# Patient Record
Sex: Male | Born: 1980 | Race: Black or African American | Hispanic: No | Marital: Married | Smoking: Never smoker
Health system: Southern US, Community
[De-identification: ages and names within clinical notes are randomized; demographics above are authoritative.]

## PROBLEM LIST (undated history)

## (undated) DIAGNOSIS — U071 COVID-19: Secondary | ICD-10-CM

## (undated) DIAGNOSIS — E119 Type 2 diabetes mellitus without complications: Secondary | ICD-10-CM

## (undated) DIAGNOSIS — I10 Essential (primary) hypertension: Secondary | ICD-10-CM

## (undated) HISTORY — DX: Essential (primary) hypertension: I10

## (undated) HISTORY — PX: KNEE ARTHROSCOPY WITH ANTERIOR CRUCIATE LIGAMENT (ACL) REPAIR: SHX5644

## (undated) HISTORY — DX: Type 2 diabetes mellitus without complications: E11.9

---

## 2005-05-20 ENCOUNTER — Emergency Department (HOSPITAL_COMMUNITY): Admission: EM | Admit: 2005-05-20 | Discharge: 2005-05-20 | Payer: Self-pay | Admitting: Emergency Medicine

## 2010-10-09 ENCOUNTER — Emergency Department (HOSPITAL_COMMUNITY): Admission: EM | Admit: 2010-10-09 | Discharge: 2010-10-09 | Payer: Self-pay | Admitting: Family Medicine

## 2011-10-29 ENCOUNTER — Inpatient Hospital Stay (INDEPENDENT_AMBULATORY_CARE_PROVIDER_SITE_OTHER)
Admission: RE | Admit: 2011-10-29 | Discharge: 2011-10-29 | Disposition: A | Discharge status: Home / Self Care | Payer: BC Managed Care – PPO | Source: Ambulatory Visit | Attending: Family Medicine | Admitting: Family Medicine

## 2011-10-29 DIAGNOSIS — S335XXA Sprain of ligaments of lumbar spine, initial encounter: Secondary | ICD-10-CM

## 2012-03-06 ENCOUNTER — Ambulatory Visit: Payer: BC Managed Care – PPO | Admitting: Internal Medicine

## 2013-03-31 ENCOUNTER — Ambulatory Visit
Admission: RE | Admit: 2013-03-31 | Discharge: 2013-03-31 | Disposition: A | Discharge status: Home / Self Care | Payer: BC Managed Care – PPO | Source: Ambulatory Visit | Attending: Internal Medicine | Admitting: Internal Medicine

## 2013-03-31 ENCOUNTER — Other Ambulatory Visit: Payer: Self-pay | Admitting: Family

## 2013-03-31 DIAGNOSIS — Z139 Encounter for screening, unspecified: Secondary | ICD-10-CM

## 2013-07-10 ENCOUNTER — Encounter (HOSPITAL_COMMUNITY): Payer: Self-pay | Admitting: Emergency Medicine

## 2013-07-10 ENCOUNTER — Emergency Department (HOSPITAL_COMMUNITY)
Admission: EM | Admit: 2013-07-10 | Discharge: 2013-07-11 | Disposition: A | Discharge status: Home / Self Care | Payer: BC Managed Care – PPO | Attending: Emergency Medicine | Admitting: Emergency Medicine

## 2013-07-10 DIAGNOSIS — I10 Essential (primary) hypertension: Secondary | ICD-10-CM | POA: Insufficient documentation

## 2013-07-10 DIAGNOSIS — F10920 Alcohol use, unspecified with intoxication, uncomplicated: Secondary | ICD-10-CM

## 2013-07-10 DIAGNOSIS — R112 Nausea with vomiting, unspecified: Secondary | ICD-10-CM | POA: Insufficient documentation

## 2013-07-10 DIAGNOSIS — D696 Thrombocytopenia, unspecified: Secondary | ICD-10-CM

## 2013-07-10 DIAGNOSIS — F1010 Alcohol abuse, uncomplicated: Secondary | ICD-10-CM | POA: Insufficient documentation

## 2013-07-10 HISTORY — DX: Essential (primary) hypertension: I10

## 2013-07-10 LAB — URINALYSIS, ROUTINE W REFLEX MICROSCOPIC
Glucose, UA: NEGATIVE mg/dL
Ketones, ur: NEGATIVE mg/dL
Leukocytes, UA: NEGATIVE
Nitrite: NEGATIVE
Protein, ur: NEGATIVE mg/dL
Urobilinogen, UA: 0.2 mg/dL (ref 0.0–1.0)

## 2013-07-10 LAB — RAPID URINE DRUG SCREEN, HOSP PERFORMED
Amphetamines: NOT DETECTED
Opiates: NOT DETECTED

## 2013-07-10 LAB — COMPREHENSIVE METABOLIC PANEL
ALT: 29 U/L (ref 0–53)
Alkaline Phosphatase: 76 U/L (ref 39–117)
BUN: 12 mg/dL (ref 6–23)
CO2: 25 mEq/L (ref 19–32)
Chloride: 104 mEq/L (ref 96–112)
GFR calc Af Amer: >90 mL/min (ref >90)
GFR calc non Af Amer: >90 mL/min (ref >90)
Glucose, Bld: 110 mg/dL — ABNORMAL HIGH (ref 70–99)
Potassium: 4 mEq/L (ref 3.5–5.1)
Sodium: 141 mEq/L (ref 135–145)
Total Bilirubin: 0.2 mg/dL — ABNORMAL LOW (ref 0.3–1.2)
Total Protein: 7.5 g/dL (ref 6.0–8.3)

## 2013-07-10 LAB — CBC
Hemoglobin: 13.3 g/dL (ref 13.0–17.0)
MCH: 25.2 pg — ABNORMAL LOW (ref 26.0–34.0)
MCHC: 31.6 g/dL (ref 30.0–36.0)
Platelets: 135 10*3/uL — ABNORMAL LOW (ref 150–400)

## 2013-07-10 LAB — GLUCOSE, CAPILLARY: Glucose-Capillary: 99 mg/dL (ref 70–99)

## 2013-07-10 LAB — ETHANOL: Alcohol, Ethyl (B): 159 mg/dL — ABNORMAL HIGH (ref 0–11)

## 2013-07-10 MED ORDER — SODIUM CHLORIDE 0.9 % IV SOLN
1000.0000 mL | Freq: Once | INTRAVENOUS | Status: AC
  Administered 2013-07-10: 1000 mL via INTRAVENOUS

## 2013-07-10 MED ORDER — SODIUM CHLORIDE 0.9 % IV SOLN: 1000.0000 mL | INTRAVENOUS | Status: DC

## 2013-07-10 NOTE — ED Notes (Signed)
Per EMS, [place] [employer] [place]. Had a syncopal episode with vomiting. 12 lead WNL, No pain. 18g RAC with 250 cc bolus enroute.

## 2013-07-10 NOTE — ED Notes (Signed)
Pt is unable to urinate at this time. 

## 2013-07-10 NOTE — ED Provider Notes (Signed)
History    CSN: [id number] Arrival date & time 07/10/13  2008  First MD Initiated Contact with Patient 07/10/13 2016     Chief Complaint  Patient presents with  . Alcohol Intoxication   (Consider location/radiation/quality/duration/timing/severity/associated sxs/prior Treatment) Patient is a 32 y.o. male presenting with intoxication. The history is provided by the patient and medical records. No language interpreter was used.  Alcohol Intoxication Associated symptoms include nausea and vomiting. Pertinent negatives include no abdominal pain, chest pain, coughing, diaphoresis, fatigue, fever, headaches or rash.    Jia D Rafalski is a 32 y.o. male  with a hx of HTN presents to the Emergency Department complaining of acute, resolved syncopal episode with associated nausea and vomiting occuring approx prior to arrival.  Pt was with friends at the beer fest drinking an unknown quantity of alcohol.  Pt's friend went to the bathroom and found the pt lying on the ground vomiting.  His friend reported that there was a syncopal episode. Pt states that he sat down and then layed down on the ground before vomiting and passing out.  Pt denies fall, headache, neck pain  EMS reports normal ECG and no injuries found on evaluation. Pt denies fever, chills, headache, neck pain, chest pain, SOB, abd pain, diarrhea, weakness, dizziness, syncope, dysuria, hematuria.    Past Medical History  Diagnosis Date  . Hypertension    History reviewed. No pertinent past surgical history. History reviewed. No pertinent family history. History  Substance Use Topics  . Smoking status: Never Smoker   . Smokeless tobacco: Not on file  . Alcohol Use: Yes    Review of Systems  Constitutional: Negative for fever, diaphoresis, appetite change, fatigue and unexpected weight change.  HENT: Negative for mouth sores and neck stiffness.   Eyes: Negative for visual disturbance.  Respiratory: Negative for cough, chest  tightness, shortness of breath and wheezing.   Cardiovascular: Negative for chest pain.  Gastrointestinal: Positive for nausea and vomiting. Negative for abdominal pain, diarrhea and constipation.  Endocrine: Negative for polydipsia, polyphagia and polyuria.  Genitourinary: Negative for dysuria, urgency, frequency and hematuria.  Musculoskeletal: Negative for back pain.  Skin: Negative for rash.  Allergic/Immunologic: Negative for immunocompromised state.  Neurological: Positive for syncope (questionable). Negative for light-headedness and headaches.  Hematological: Does not bruise/bleed easily.  Psychiatric/Behavioral: Negative for sleep disturbance. The patient is not nervous/anxious.     Allergies  Review of patient's allergies indicates no known allergies.  Home Medications  No current outpatient prescriptions on file. BP 118/70  Pulse 78  Temp(Src) 97.7 F (36.5 C) (Oral)  Resp 18  SpO2 94% Physical Exam  Nursing note and vitals reviewed. Constitutional: He is oriented to person, place, and time. He appears well-developed and well-nourished. No distress.  Awake, alert, nontoxic appearance  HENT:  Head: Normocephalic and atraumatic.  Right Ear: Tympanic membrane, external ear and ear canal normal.  Left Ear: Tympanic membrane, external ear and ear canal normal.  Nose: Nose normal. No mucosal edema or rhinorrhea.  Mouth/Throat: Uvula is midline, oropharynx is clear and moist and mucous membranes are normal. No edematous. No oropharyngeal exudate, posterior oropharyngeal edema, posterior oropharyngeal erythema or tonsillar abscesses.  No contusion or abrasion to the head noted  Eyes: Conjunctivae are normal. No scleral icterus.  Neck: Normal range of motion. Neck supple.  Cardiovascular: Normal rate, regular rhythm, normal heart sounds and intact distal pulses.   No murmur heard. Pulmonary/Chest: Effort normal and breath sounds normal. No respiratory distress. He  has no  wheezes.  Abdominal: Soft. Bowel sounds are normal. He exhibits no mass. There is no tenderness. There is no rebound and no guarding.  Musculoskeletal: Normal range of motion. He exhibits no edema.  Full ROM of all major joints  Neurological: He is alert and oriented to person, place, and time. No cranial nerve deficit. He exhibits normal muscle tone. Coordination normal.  Pt significantly intoxicated, but with prompting speech is clear and he answers questions appropriately. Moves extremities without ataxia  Skin: Skin is warm and dry. No rash noted. He is not diaphoretic. No erythema.  Psychiatric: He has a normal mood and affect. His behavior is normal.    ED Course  Procedures (including critical care time) Labs Reviewed  ETHANOL - Abnormal; Notable for the following:    Alcohol, Ethyl (B) 159 (*)    All other components within normal limits  CBC - Abnormal; Notable for the following:    MCH 25.2 (*)    Platelets 135 (*)    All other components within normal limits  COMPREHENSIVE METABOLIC PANEL - Abnormal; Notable for the following:    Glucose, Bld 110 (*)    Total Bilirubin 0.2 (*)    All other components within normal limits  URINALYSIS, ROUTINE W REFLEX MICROSCOPIC  URINE RAPID DRUG SCREEN (HOSP PERFORMED)  GLUCOSE, CAPILLARY  TROPONIN I  POCT CBG (FASTING - GLUCOSE)-MANUAL ENTRY   ECG:  Date: 07/11/2013  Rate: 79  Rhythm: normal sinus rhythm  QRS Axis: normal  Intervals: normal  ST/T Wave abnormalities: normal  Conduction Disutrbances:none  Narrative Interpretation: Nonischemic ECG, normal sinus rhythm, no old for comparison  Old EKG Reviewed: none available    No results found. 1. Alcohol intoxication, episodic, uncomplicated   2. Thrombocytopenia     MDM  Koi D Brumett presents with questionable syncope and intoxication.  No injury found on exam.  Pt to be given fluids and will check labs.  CBG WNL.    12:15 AM  Pt neurologically intact.  Normal  neuro exam: Speech is clear and goal oriented, follows commands Major Cranial nerves without deficit, no facial droop Normal strength in upper and lower extremities bilaterally including dorsiflexion and plantar flexion, strong and equal grip strength Sensation normal to light and sharp touch Moves extremities without ataxia, coordination intact Normal finger to nose and rapid alternating movements Neg romberg, no pronator drift Normal gait and balance  UDS negative, UA without evidence of UTI, CBC and CMP largely unremarkable.  Pt with low platelets found on CBC; recommend f/u with PCP.  EtOH 159.  Pt ambulates without difficulty, is alert and oriented, nontoxic, nonseptic appearing.  Pt requests to be d/c home. NO CT scan of the head warranted as there is no evidence that he hit his head and his mentation is adequate and he is without neuro deficit.  Will d/c home with strict return precautions.  I have also discussed reasons to return immediately to the ER with pts wife as well.  Patient expresses understanding and agrees with plan.    Dahlia Client Kyasia Steuck, PA-C 07/11/13 0153

## 2013-07-10 NOTE — ED Notes (Signed)
Bed:WA15<BR> Expected date:<BR> Expected time:<BR> Means of arrival:<BR> Comments:<BR> EMS

## 2013-07-10 NOTE — ED Notes (Signed)
Pt attempted to urinate and was unsuccessful 

## 2013-07-12 NOTE — ED Provider Notes (Signed)
Medical screening examination/treatment/procedure(s) were performed by non-physician practitioner and as supervising physician I was immediately available for consultation/collaboration.   Ronny Korff L Younes Degeorge, MD 07/12/13 1539 

## 2016-04-23 ENCOUNTER — Other Ambulatory Visit: Payer: Self-pay | Admitting: Nurse Practitioner

## 2016-04-23 ENCOUNTER — Ambulatory Visit
Admission: RE | Admit: 2016-04-23 | Discharge: 2016-04-23 | Disposition: A | Discharge status: Home / Self Care | Payer: BLUE CROSS/BLUE SHIELD | Source: Ambulatory Visit | Attending: Internal Medicine | Admitting: Internal Medicine

## 2016-04-23 DIAGNOSIS — R7611 Nonspecific reaction to tuberculin skin test without active tuberculosis: Secondary | ICD-10-CM | POA: Diagnosis not present

## 2016-04-30 DIAGNOSIS — Z23 Encounter for immunization: Secondary | ICD-10-CM | POA: Diagnosis not present

## 2016-05-02 DIAGNOSIS — R7309 Other abnormal glucose: Secondary | ICD-10-CM | POA: Diagnosis not present

## 2016-05-02 DIAGNOSIS — I10 Essential (primary) hypertension: Secondary | ICD-10-CM | POA: Diagnosis not present

## 2016-05-02 DIAGNOSIS — Z79899 Other long term (current) drug therapy: Secondary | ICD-10-CM | POA: Diagnosis not present

## 2016-05-02 DIAGNOSIS — E669 Obesity, unspecified: Secondary | ICD-10-CM | POA: Diagnosis not present

## 2016-05-02 DIAGNOSIS — Z0000 Encounter for general adult medical examination without abnormal findings: Secondary | ICD-10-CM | POA: Diagnosis not present

## 2016-08-01 DIAGNOSIS — R7309 Other abnormal glucose: Secondary | ICD-10-CM | POA: Diagnosis not present

## 2016-08-01 DIAGNOSIS — I10 Essential (primary) hypertension: Secondary | ICD-10-CM | POA: Diagnosis not present

## 2016-10-31 DIAGNOSIS — I10 Essential (primary) hypertension: Secondary | ICD-10-CM | POA: Diagnosis not present

## 2016-10-31 DIAGNOSIS — R0683 Snoring: Secondary | ICD-10-CM | POA: Diagnosis not present

## 2016-11-27 ENCOUNTER — Encounter: Payer: Self-pay | Admitting: Neurology

## 2016-11-27 ENCOUNTER — Ambulatory Visit (INDEPENDENT_AMBULATORY_CARE_PROVIDER_SITE_OTHER): Payer: BLUE CROSS/BLUE SHIELD | Admitting: Neurology

## 2016-11-27 VITALS — BP 171/107 | HR 80 | Resp 20 | Ht 68.0 in | Wt 236.0 lb

## 2016-11-27 DIAGNOSIS — R0683 Snoring: Secondary | ICD-10-CM | POA: Diagnosis not present

## 2016-11-27 DIAGNOSIS — G4719 Other hypersomnia: Secondary | ICD-10-CM | POA: Diagnosis not present

## 2016-11-27 DIAGNOSIS — G4726 Circadian rhythm sleep disorder, shift work type: Secondary | ICD-10-CM | POA: Diagnosis not present

## 2016-11-27 DIAGNOSIS — G4733 Obstructive sleep apnea (adult) (pediatric): Secondary | ICD-10-CM

## 2016-11-27 DIAGNOSIS — E66812 Obesity, class 2: Secondary | ICD-10-CM | POA: Insufficient documentation

## 2016-11-27 DIAGNOSIS — Z6841 Body Mass Index (BMI) 40.0 and over, adult: Secondary | ICD-10-CM | POA: Insufficient documentation

## 2016-11-27 MED ORDER — MODAFINIL 200 MG PO TABS: 200.0000 mg | ORAL_TABLET | Freq: Every day | ORAL | 5 refills | Status: DC

## 2016-11-27 NOTE — Progress Notes (Signed)
SLEEP MEDICINE CLINIC   Provider:  Melvyn Novas, M D  Referring Provider: Arnette Felts, FNP Primary Care Physician:  No primary care provider on file.  Chief Complaint  Patient presents with  . New Patient (Initial Visit)    snores at night, never had sleep study    HPI:  Austin [name] is a 35 y.o. male , seen here as a referral from NP J.  Moore for a sleep consultation,  Mr. kon is a 35 year old married right-handed African-American gentleman who began developing hypertension in the year 2004 at age 50. He has a family history positive for hypertension, gout, but no family history of coronary artery disease or myocardial infarction. He does have an active lifestyle, he does not have distinct exercise routine. He works night shifts. His wife reports him to snore loudly and to have witnessed apneas.  Chief complaint according to patient : He works night shifts. His wife reports him to snore loudly and to have witnessed apneas. Overall he feels that his sleep quality has deteriorated. His sleep is not as refreshing as restorative as it used to be.  Sleep habits are as follows: His work routine is 2 days on night shift followed by 2 days off, then followed by 3 days on shift and 3 days off. He wakes up usually around 6 AM when he could sleep overnight. His bedtime is around 10 PM. The bedroom is described as core, quiet and dark. He prefers to sleep on his stomach and uses one pillow. He usually sleeps through the night between 10 and 6 getting 7-1/2 hours of sleep. He does sometimes remember his dreams, he has no nocturia.  Night shift last from 7 PM to 7 AM. 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 This can be one or 2 hours. Again it is not sufficient to restore or refresh him. When he works night shifts , he returns from shifts at home at around 8, tries to sleep for an hour or 2 but never gets even 4 hours of sleep on those days. After  3 days of consecutive night shifts he is very exhausted.  He is also a Theatre stage manager and hopes that with completion of his degree he will be able to work day shifts.  Sleep medical history and family sleep history:  Mr. Embleton did not experience night terrors, sleep walking or enuresis in childhood. He does not have family members diagnosed with apnea to his knowledge.   Social history: Married with 3 children, night shift Financial controller, paramedic, nursing school in daytime. He has never used tobacco products, on social occasions alcohol, not regular, caffeine use however is rather frequent mostly in form of coffee, usually about 64 ounces or more. Rarely does he drinks soda or iced tea.  Review of Systems: Out of a complete 14 system review, the patient complains of only the following symptoms, and all other reviewed systems are negative. Snoring, apnea, no nocturia, diaphoresis or palpitation.    How likely are you to doze in the following situations: 0 = not likely, 1 = slight chance, 2 = moderate chance, 3 = high chance  Sitting and Reading? 2 Watching Television?2 Sitting inactive in a public place (theater or meeting)?2 Lying down in the afternoon when circumstances permit?3 Sitting and talking to someone?3 Sitting quietly after lunch without alcohol?3In a car, while stopped for a few minutes in traffic?1 As a passenger in a  car for an hour without a break?2  Total = Epworth score is endorsed at 18 points, very high , Fatigue severity score at 28 points , depression score n/a    Social History   Social History  . Marital status: Married    Spouse name: N/A  . Number of children: N/A  . Years of education: N/A   Occupational History  . Not on file.   Social History Main Topics  . Smoking status: Never Smoker  . Smokeless tobacco: Never Used  . Alcohol use 1.2 oz/week    2 Standard drinks or equivalent per week  . Drug use: No  . Sexual activity: Not on file   Other  Topics Concern  . Not on file   Social History Narrative  . No narrative on file    No family history on file.  Past Medical History:  Diagnosis Date  . Hypertension     Past Surgical History:  Procedure Laterality Date  . KNEE ARTHROSCOPY WITH ANTERIOR CRUCIATE LIGAMENT (ACL) REPAIR      Current Outpatient Prescriptions  Medication Sig Dispense Refill  . amLODipine (NORVASC) 10 MG tablet Take 10 mg by mouth daily.    . fluticasone (FLONASE) 50 MCG/ACT nasal spray Place into both nostrils daily.    Marland Kitchen. loratadine (CLARITIN) 10 MG tablet Take 10 mg by mouth daily.    . meloxicam (MOBIC) 15 MG tablet Take 15 mg by mouth daily.    Marland Kitchen. olmesartan-hydrochlorothiazide (BENICAR HCT) 40-25 MG tablet Take 1 tablet by mouth daily.     No current facility-administered medications for this visit.     Allergies as of 11/27/2016  . (No Known Allergies)    Vitals: BP (!) 171/107   Pulse 80   Resp 20   Ht 5\' 8"  (1.727 m)   Wt 236 lb (107 kg)   BMI 35.88 kg/m  Last Weight:  Wt Readings from Last 1 Encounters:  11/27/16 236 lb (107 kg)   WUJ:WJXBBMI:Body mass index is 35.88 kg/m.     Last Height:   Ht Readings from Last 1 Encounters:  11/27/16 5\' 8"  (1.727 m)    Physical exam:  General: The patient is awake, alert and appears not in acute distress. The patient is well groomed. Head: Normocephalic, atraumatic. Neck is supple. Mallampati 3  neck circumference:18. Nasal airflow patent,  Retrognathia is seen.  He has never worn a retainer or braces, he is all natural teeth. Cardiovascular:  Regular rate and rhythm , without  murmurs or carotid bruit, and without distended neck veins. Respiratory: Lungs are clear to auscultation. Skin:  Without evidence of edema, or rash Trunk: BMI is 36. The patient's posture is erect  Neurologic exam : The patient is awake and alert, oriented to place and time.   Memory subjective described as intact.  Attention span & concentration ability appears  normal.  Speech is fluent,  without dysarthria, dysphonia or aphasia.  Mood and affect are appropriate.  Cranial nerves: Pupils are equal and briskly reactive to light. He wears contacts.  Extraocular movements  in vertical and horizontal planes intact and without nystagmus. Visual fields by finger perimetry are intact. Hearing to finger rub intact. Facial sensation intact to fine touch.Facial motor strength is symmetric and tongue and uvula move midline. Shoulder shrug was symmetrical.   Motor exam:  Normal tone, muscle bulk and symmetric strength in all extremities. Sensory:  N/A Coordination:  Finger-to-nose maneuver  normal without evidence of ataxia, dysmetria or tremor.  Gait and station: Patient walks without assistive device and is able unassisted to climb up to the exam table. Strength within normal limits.  Stance is stable and normal.   Deep tendon reflexes: in the  upper and lower extremities are symmetric and intact. The patient was advised of the nature of the diagnosed sleep disorder , the treatment options and risks for general a health and wellness arising from not treating the condition.  I spent more than 30 minutes of face to face time with the patient. Greater than 50% of time was spent in counseling and coordination of care. We have discussed the diagnosis and differential and I answered the patient's questions.     Assessment:  After physical and neurologic examination, review of laboratory studies,  Personal review of imaging studies, reports of other /same  Imaging studies ,  Results of polysomnography/ neurophysiology testing and pre-existing records as far as provided in visit., my assessment is   1) Mr. [name] has definite risk factors for obstructive sleep apnea including his body mass index which constitutes morbid obesity at this time, he also has a larger than average neck circumference 18 inches and a high-grade Mallampati. He suffers from some seasonal rhinitis  nasal airflow restriction but is able to breathe through the nose unhindered today. This may sometimes force him to mouth breathe. He has been observed to snore and his wife has stated that he has apneas. He has no history of coronary artery disease, diabetes and his hypertension is well controlled at this time on 3 drugs. He does not have nocturia but he has excessive daytime sleepiness.  2) Mr. [name] primary sleep disorder is sleep deprivation due to rotating night shift work. He is unable to find restorative sleep in daytime following  3 days of night time work -after  night shifts he is usually very exhausted. He endorsed today the Epworth sleepiness score at 18 points, a level that is usually seen in narcolepsy. I advised him to drive carefully and make judgments and operating machinery due to daytime sleepiness. I will hesitant to put him on a stimulant because it will raise his blood pressure. I do think that if we find him to have obstructive sleep apnea he will likely get better nocturnal sleep after having the underlying condition treated. In the meantime I will prescribe modafinil for this shift worker and medication that is not habit-forming and allows him to stay alert during the daytime.  3) Caffeine overuse-     Plan:  Treatment plan and additional workup : I will order modafinil for this patient, I will order a home sleep test to evaluate his degree of apnea, it could also be easier for him to schedule this kind of test due to his shift work 2 months. He can do this test in the privacy of his own home and as he told me that he does not have insomnia, I will work with the recorded night time data as if they reflect sleep.  Rv after study.      Porfirio Mylararmen Pharaoh Pio MD  11/27/2016   CC: Arnette FeltsJanece Moore [address] [place], [place] [id number]

## 2016-11-27 NOTE — Patient Instructions (Signed)

## 2016-11-28 ENCOUNTER — Telehealth: Payer: Self-pay

## 2016-11-28 NOTE — Telephone Encounter (Signed)
I completed pa for modafinil and sent it to [employer]. Should have a determination in 1-3 business days.

## 2016-12-02 NOTE — Telephone Encounter (Signed)
Received a pa determination from [employer] that modafinil was approved effective from 11/28/2016 through 11/27/2018.   I called [facility], spoke to ElkhartBridgette, and notified her of decision.

## 2016-12-03 ENCOUNTER — Telehealth: Payer: Self-pay | Admitting: Neurology

## 2016-12-03 DIAGNOSIS — G4730 Sleep apnea, unspecified: Principal | ICD-10-CM

## 2016-12-03 DIAGNOSIS — G4733 Obstructive sleep apnea (adult) (pediatric): Secondary | ICD-10-CM

## 2016-12-03 DIAGNOSIS — G4710 Hypersomnia, unspecified: Secondary | ICD-10-CM

## 2016-12-03 NOTE — Telephone Encounter (Signed)
Dr. Vickey Hugerohmeier, Insurance is going to deny the HST so I want to know if we can do a Split sleep test on this patient?  If so, will you please put in an order for a Split?

## 2016-12-03 NOTE — Telephone Encounter (Signed)
Ordered split. HST denied by insurance.

## 2016-12-19 ENCOUNTER — Ambulatory Visit (INDEPENDENT_AMBULATORY_CARE_PROVIDER_SITE_OTHER): Payer: BLUE CROSS/BLUE SHIELD | Admitting: Neurology

## 2016-12-19 DIAGNOSIS — G4730 Sleep apnea, unspecified: Secondary | ICD-10-CM

## 2016-12-19 DIAGNOSIS — G4710 Hypersomnia, unspecified: Secondary | ICD-10-CM | POA: Diagnosis not present

## 2016-12-19 DIAGNOSIS — G4733 Obstructive sleep apnea (adult) (pediatric): Secondary | ICD-10-CM

## 2016-12-27 ENCOUNTER — Telehealth: Payer: Self-pay | Admitting: Neurology

## 2016-12-27 DIAGNOSIS — G4734 Idiopathic sleep related nonobstructive alveolar hypoventilation: Secondary | ICD-10-CM

## 2016-12-27 DIAGNOSIS — G4733 Obstructive sleep apnea (adult) (pediatric): Secondary | ICD-10-CM

## 2016-12-27 NOTE — Procedures (Signed)
PATIENT'S NAME:  Austin [name], Ab DOB:      06/08/1981      MR#:    [id number]     DATE OF RECORDING: 12/19/2016 REFERRING M.D.:  Austin FeltsJanece Moore, FNP Study Performed:   Diagnostic Polysomnogram HISTORY:  Austin [name] is a 35 y.o. male, seen here for a sleep evaluation. He developed HTN at age 35, gained weight. 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 He is excessively daytime sleepy.  The patient endorsed the Epworth Sleepiness Scale at 18 points.   The patient's weight 236 pounds with a height of 68 (inches), resulting in a BMI of 35.8 kg/m2. The patient's neck circumference measured 18 inches.  CURRENT MEDICATIONS: Norvasc, Flonase, Claritin, Mobic, Benicar   PROCEDURE:  This is a multichannel digital polysomnogram utilizing the Somnostar 11.2 system.  Electrodes and sensors were applied and monitored per AASM Specifications.   EEG, EOG, Chin and Limb EMG, were sampled at 200 Hz.  ECG, Snore and Nasal Pressure, Thermal Airflow, Respiratory Effort, CPAP Flow and Pressure, Oximetry was sampled at 50 Hz. Digital video and audio were recorded.      BASELINE STUDY: Lights Out was at 21:16 and Lights On at 05:11.  Total recording time (TRT) was 475.5 minutes, with a total sleep time (TST) of 434.5 minutes.   The patient's sleep latency was 9.5 minutes.  REM latency was 62.5 minutes.  The sleep efficiency was 91.4 %.     SLEEP ARCHITECTURE: WASO (Wake after sleep onset) was 31 minutes.  There were 9 minutes in Stage N1, 299.5 minutes Stage N2, 65.5 minutes Stage N3 and 60.5 minutes in Stage REM.  The percentage of Stage N1 was 2.1%, Stage N2 was 68.9%, Stage N3 was 15.1% and Stage R (REM sleep) was 13.9%.   The arousals were noted as: 109 were spontaneous, 6 were associated with PLMs, and 55 were associated with respiratory events.  RESPIRATORY ANALYSIS:  There were a total of 88 respiratory events:  8 obstructive apneas, 0 central apneas  and 80 hypopneas with a hypopnea index of 11.0 /hr. The patient also had 0 respiratory event related arousals (RERAs). The total APNEA/HYPOPNEA INDEX (AHI) was 12.2/hour and the total RESPIRATORY DISTURBANCE INDEX was 12.2 /hour.  25 events occurred in REM sleep and 110 events in NREM. The REM AHI was 24.8 /hour, versus a non-REM AHI of 10.1. The patient spent 316.5 minutes of total sleep time in the supine position and 118 minutes in non-supine. The supine AHI was 9.9 versus a non-supine AHI of 18.3.  OXYGEN SATURATION & C02:  The Wake baseline 02 saturation was 94%, with the lowest being 79%. Time spent below 89% saturation equaled 84 minutes.   PERIODIC LIMB MOVEMENTS:  The Periodic Limb Movement (PLM) index was 0.8 and the PLM Arousal index was 0.8/hour.  Audio and video analysis did not show any abnormal or unusual movements, behaviors, phonations or vocalizations.   Very loud Snoring was noted. EKG was in keeping with normal sinus rhythm (NSR).   IMPRESSION:  1. Mild Obstructive Sleep Apnea (OSA) with an AHI of 12.2 and REM AHI of 24.8 /hr.  2. Hypoxemia: prolonged oxygen desaturation time , over 80 minutes.  3. Loud Snoring  RECOMMENDATIONS:  1. Advise full-night, attended, CPAP titration study to optimize therapy.  If patient is unable to return, order auto titration instead.  2. Further information regarding OSA may be obtained from [employer] (www.sleepfoundation.org) or  American Sleep Apnea Association (www.sleepapnea.org). 3. Avoid caffeine-containing beverages and chocolate. 4. A follow up appointment will be scheduled in [facility]. The referring provider will be notified of the results.      I certify that I have reviewed the entire raw data recording prior to the issuance of this report in accordance with the Standards of Accreditation of the American Academy of Sleep Medicine (AASM)   Melvyn Novasarmen Angely Dietz, MD   12-27-2016 Diplomat, American Board of Psychiatry and Neurology  Diplomat, American Board of Sleep Medicine Medical Director, [facility]

## 2017-01-02 ENCOUNTER — Telehealth: Payer: Self-pay

## 2017-01-02 NOTE — Telephone Encounter (Signed)
-----   Message from Melvyn Novasarmen Dohmeier, MD sent at 12/27/2016 11:48 AM EST ----- IMPRESSION:  1. Mild Obstructive Sleep Apnea (OSA) with an AHI of 12.2 and REM AHI of 24.8 /hr.  2. Hypoxemia: prolonged oxygen desaturation time , over 80 minutes.  3. Loud Snoring  RECOMMENDATIONS:  1. Advise full-night, attended, CPAP titration study to optimize therapy.  If patient is unable to return, order auto titration instead.

## 2017-01-02 NOTE — Telephone Encounter (Signed)
I spoke to pt and advised him that his sleep study results revealed mild osa with hypoxemia and loud snoring. I advised pt that Dr. Vickey Hugerohmeier recommends a full night cpap titration to optimize therapy. Pt is agreeable to this and said that the sleep lab has already called him to schedule his cpap titration. Pt verbalized understanding of results. Pt had no questions at this time but was encouraged to call back if questions arise.

## 2017-01-02 NOTE — Telephone Encounter (Signed)
See telephone note from 01/02/2017.

## 2017-01-29 ENCOUNTER — Ambulatory Visit (INDEPENDENT_AMBULATORY_CARE_PROVIDER_SITE_OTHER): Payer: BLUE CROSS/BLUE SHIELD | Admitting: Neurology

## 2017-01-29 DIAGNOSIS — G4734 Idiopathic sleep related nonobstructive alveolar hypoventilation: Secondary | ICD-10-CM

## 2017-01-29 DIAGNOSIS — G4733 Obstructive sleep apnea (adult) (pediatric): Secondary | ICD-10-CM

## 2017-01-30 ENCOUNTER — Telehealth: Payer: Self-pay | Admitting: Neurology

## 2017-01-30 DIAGNOSIS — G4733 Obstructive sleep apnea (adult) (pediatric): Secondary | ICD-10-CM

## 2017-01-30 NOTE — Procedures (Signed)
PATIENT'S NAME:  Austin [name], Briar DOB:      02/07/1981      MR#:    [id number]     DATE OF RECORDING: 01/29/2017 REFERRING M.D.:  Arnette FeltsJanece Moore, FNP Study Performed:   CPAP  Titration HISTORY:  Pt presents for CPAP titration study following a PSG performed on 12/19/2016 at [facility], resulting in an AHI 12.2 and REM AHI was 24.8 /hr.  SPO2 nadir was measured at 79% with 82 minutes of SpO2 saturation at or below 88%. The Patient is excessively daytime sleepy.   The patient endorsed the Epworth Sleepiness Scale at 18 points.   The patient's weight 236 pounds with a height of 68 (inches), resulting in a BMI of 35.8 kg/m2. The patient's neck circumference measured 18 inches.  CURRENT MEDICATIONS: Norvasc, Flonase, Claritin, Mobic, Benicar   PROCEDURE:  This is a multichannel digital polysomnogram utilizing the SomnoStar 11.2 system.  Electrodes and sensors were applied and monitored per AASM Specifications.   EEG, EOG, Chin and Limb EMG, were sampled at 200 Hz.  ECG, Snore and Nasal Pressure, Thermal Airflow, Respiratory Effort, CPAP Flow and Pressure, Oximetry was sampled at 50 Hz. Digital video and audio were recorded.     CPAP was initiated at 5 cmH20 with heated humidity per AASM split night standards and pressure was advanced to 9/9cmH20 because of hypopneas, apneas and desaturations.  At a PAP pressure of 9 cmH20, there was a reduction of the AHI to 0.0 with improvement of the above symptoms of obstructive sleep apnea.    Lights Out was at 22:46 and Lights On at 05:17. Total recording time (TRT) was 391 minutes, with a total sleep time (TST) of 379.5 minutes. The patient's sleep latency was 2 minutes with 0 minutes of wake time after sleep onset. REM latency was 76 minutes.  The sleep efficiency was 97.1 %.    SLEEP ARCHITECTURE: WASO (Wake after sleep onset) was 9.5 minutes.  There were 17.5 minutes in Stage N1, 195.5 minutes Stage N2, 80.5 minutes Stage N3 and 86 minutes in Stage REM.  The  percentage of Stage N1 was 4.6%, Stage N2 was 51.5%, Stage N3 was 21.2% and Stage R (REM sleep) was 22.7%.   RESPIRATORY ANALYSIS:  There were 0 respiratory events: The patient also had 0 respiratory event related arousals (RERAs).    The total APNEA/HYPOPNEA INDEX  (AHI) was 0.0 /hr. and the total RESPIRATORY DISTURBANCE INDEX was 0.0 /hr. The patient spent 320 minutes of total sleep time in the supine position and 60 minutes in non-supine. The supine AHI was 0.0, versus a non-supine AHI of 0.0.  OXYGEN SATURATION & C02:  The baseline 02 saturation was 90%, with the lowest being 69%. Time spent below 89% saturation equaled 33 minutes.  PERIODIC LIMB MOVEMENTS:   The patient had a total of 0 Periodic Limb Movements.   The arousals were noted as: 36 were spontaneous, 0 were associated with PLMs, 0 were associated with respiratory events.  Audio and video analysis did not show any abnormal or unusual movements, behaviors, phonations or vocalizations.  The patient took no bathroom breaks. Snoring was not noted while on CPAP. EKG was in keeping with normal sinus rhythm (NSR).  DIAGNOSIS OSA completely alleviated under CPAP of 9 cm water pressure with heated humidity,  The patient was fitted with a Resmed AirFit P10 medium pillows apparatus.   PLANS/RECOMMENDATIONS: 1. Start using CPAP as prescribed above.  2. Maintain lean body weight. The patient should avoid  evening sedatives, hypnotics, and alcohol beverage consumption until the above problem is corrected. 3. Sleep hygiene:  Check the bedroom environment for light, noise, temperature, or other factors which could disrupt sleep; use bedroom for sleeping and sex only; follow a regular schedule for sleeping and waking (goal is seven to eight hours sleep per night, but patient should not be in bed any time he or she does not feel like sleeping); limit naps to no more than one hour and no later than mid-afternoon; medically-supervised exercise plan,  with exercise in late afternoon and no later than three hours prior to bedtime; avoid eating just before bed; avoid caffeine for six hours before bedtime; avoid alcohol; avoid tobacco; engage only in relaxing activities prior to bed, such as quiet music, reading under soft light, relaxation exercises such as deep breathing or progressive relaxation.  Avoid looking at alarm clock in bed; turn alarm clock facing away from patient.  If the patient worries in bed, one helpful intervention is to have the patient write down a list of concerns nightly before bed; and then to put them out of mind by discarding the list, after the patient confirms that there is nothing more he/she can do about them today. 4. Driving precautions:  The patient should be instructed by the primary care physician that the [place] prohibits persons from operating a motor vehicle if sleepy, and should also be instructed to avoid driving and to avoid hazardous machinery if sleepy. The patient should be further instructed by the primary care physician that, if the decision is made to continue driving, and that if the patient becomes sleepy while driving, that the patient should pull off the road for a nap, and not drive again until no longer sleepy. A follow up appointment will be scheduled in the Sleep Clinic at [facility].   Please call [phone] with any questions.      I certify that I have reviewed the entire raw data recording prior to the issuance of this report in accordance with the Standards of Accreditation of the American Academy of Sleep Medicine (AASM)  Melvyn Novas, M.D.  01-30-2017  Diplomat, American Board of Psychiatry and Neurology  Diplomat, American Board of Sleep Medicine Medical Director, [place] [facility] at [facility]

## 2017-01-30 NOTE — Telephone Encounter (Signed)
OSA completely alleviated under CPAP of 9 cm water pressure with heated humidity,  The patient was fitted with a Resmed AirFit P10 medium pillows apparatus.

## 2017-02-04 NOTE — Telephone Encounter (Signed)
This encounter was created in error - please disregard.

## 2017-02-04 NOTE — Telephone Encounter (Signed)
I called pt. I advised him that his osa was completely alleviated under CPAP. I advised pt that Dr. Vickey Hugerohmeier wants to start pt on cpap at home. Pt is agreeable to this. I advised him that I will send the order for cpap to a DME, [facility], and they will call pt within a week. I reviewed cpap compliance expectations with the pt. Pt is agreeable to a follow up with Dr. Vickey Hugerohmeier on 04/10/2017 at 10:30am. Pt verbalized understanding of results. Pt had no questions at this time but was encouraged to call back if questions arise.

## 2017-02-25 DIAGNOSIS — G4733 Obstructive sleep apnea (adult) (pediatric): Secondary | ICD-10-CM | POA: Diagnosis not present

## 2017-03-25 DIAGNOSIS — G4733 Obstructive sleep apnea (adult) (pediatric): Secondary | ICD-10-CM | POA: Diagnosis not present

## 2017-04-10 ENCOUNTER — Encounter: Payer: Self-pay | Admitting: Neurology

## 2017-04-10 ENCOUNTER — Encounter (INDEPENDENT_AMBULATORY_CARE_PROVIDER_SITE_OTHER): Payer: Self-pay

## 2017-04-10 ENCOUNTER — Ambulatory Visit (INDEPENDENT_AMBULATORY_CARE_PROVIDER_SITE_OTHER): Payer: BLUE CROSS/BLUE SHIELD | Admitting: Neurology

## 2017-04-10 DIAGNOSIS — R0683 Snoring: Secondary | ICD-10-CM | POA: Diagnosis not present

## 2017-04-10 DIAGNOSIS — G4733 Obstructive sleep apnea (adult) (pediatric): Secondary | ICD-10-CM

## 2017-04-10 DIAGNOSIS — G4719 Other hypersomnia: Secondary | ICD-10-CM

## 2017-04-10 DIAGNOSIS — G4726 Circadian rhythm sleep disorder, shift work type: Secondary | ICD-10-CM

## 2017-04-10 MED ORDER — MODAFINIL 200 MG PO TABS: 200.0000 mg | ORAL_TABLET | Freq: Every day | ORAL | 5 refills | Status: DC

## 2017-04-10 NOTE — Progress Notes (Signed)
SLEEP MEDICINE CLINIC   Provider:  Melvyn Novas, M D  Referring Provider: No ref. provider found Primary Care Physician:    Chief Complaint  Patient presents with  . Follow-up    Rm 10. Patient states that he is doing ok on CPAP. No new concerns per patient. DME: [facility]    HPI:  Austin [name] is a 36 y.o. male , seen here as a referral from NP Arnette Felts  for a sleep consultation, Austin [name] is seen here today on 04/10/2017 following a diagnostic polysomnography which was performed on 12/19/2016 which revealed mild apnea at 12.2 AHI but accentuated by REM sleep to 24.8,. Interestingly nonsupine sleep produced an higher apnea index than when he slept on his back. Nonsupine AHI was 18.3. The patient also had prolonged periods of oxygen desaturation with a total of 84 minutes under 89% saturation. He did not have any periodic limb movements. The study was followed by a CPAP titration during which a pressure of 9 cm water pressure and deviated the apnea. The patient used an air-fit nasal pillow in medium size. The compliance report shows that the patient has used the machine 29 out of 30 days but still struggles to use it 4 hours each night. Some nights I have only 3 or barely 4 hours of use. He reports that he sometimes loses contact with the mask. The average user time is 4 hours and 52 minutes, the machine is set at 9 cm with 3 cm EPR and a residual AHI of 1.3 is achieved. He does have very few air leaks.  [facility] gave him a different mask , Dreamwear mask and he likes it better. This explains the improving trend since March 24.  The patient feels not as drowsy during the day, his blood pressure is controlled on the current medications. The patient is not diabetic.   Consult note;  Austin [name] is a 36 year old married right-handed African-American gentleman who began developing hypertension in the year 2004 at age 26. He has a family history positive for hypertension, gout, but no  family history of coronary artery disease or myocardial infarction. He does have an active lifestyle, he does not have distinct exercise routine. He works night shifts. His wife reports him to snore loudly and to have witnessed apneas.  Chief complaint according to patient : He works night shifts. His wife reports him to snore loudly and to have witnessed apneas. Overall he feels that his sleep quality has deteriorated. His sleep is not as refreshing as restorative as it used to be.  Sleep habits are as follows: His work routine is 2 days on night shift followed by 2 days off, then followed by 3 days on shift and 3 days off. He wakes up usually around 6 AM when he could sleep overnight. His bedtime is around 10 PM. The bedroom is described as core, quiet and dark. He prefers to sleep on his stomach and uses one pillow. He usually sleeps through the night between 10 and 6 getting 7-1/2 hours of sleep. He does sometimes remember his dreams, he has no nocturia.  Night shift last from 7 PM to 7 AM. 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 This can be one or 2 hours. Again it is not sufficient to restore or refresh him. When he works night shifts , he returns from shifts at home at around 8, tries to sleep for an hour  or 2 but never gets even 4 hours of sleep on those days. After 3 days of consecutive night shifts he is very exhausted.  He is also a Theatre stage manager and hopes that with completion of his degree he will be able to work day shifts. Sleep medical history and family sleep history:  Austin [name] did not experience night terrors, sleep walking or enuresis in childhood. He does not have family members diagnosed with apnea to his knowledge. Social history: Married with 3 children, night shift Financial controller, paramedic, nursing school in daytime. He has never used tobacco products, on social occasions alcohol, not regular, caffeine use however is rather frequent mostly  in form of coffee, usually about 64 ounces or more.Rarely does he drinks soda or iced tea.  Review of Systems: Out of a complete 14 system review, the patient complains of only the following symptoms, and all other reviewed systems are negative. Snoring, apnea, no nocturia, diaphoresis or palpitation.    How likely are you to doze in the following situations: 0 = not likely, 1 = slight chance, 2 = moderate chance, 3 = high chance  Sitting and Reading? 1 Watching Television?1 Sitting inactive in a public place (theater or meeting)?0 Lying down in the afternoon when circumstances permit?2 Sitting and talking to someone?0 Sitting quietly after lunch without alcohol?3In a car, while stopped for a few minutes in traffic?0 As a passenger in a car for an hour without a break?2  Total = Epworth score is endorsed at 18 points, very high now under CPAP at 9  Points !   , Fatigue severity score at 28 points , depression score n/a     Past Medical History:  Diagnosis Date  . Hypertension     Past Surgical History:  Procedure Laterality Date  . KNEE ARTHROSCOPY WITH ANTERIOR CRUCIATE LIGAMENT (ACL) REPAIR      Current Outpatient Prescriptions  Medication Sig Dispense Refill  . amLODipine (NORVASC) 10 MG tablet Take 10 mg by mouth daily.    . fluticasone (FLONASE) 50 MCG/ACT nasal spray Place into both nostrils daily.    Marland Kitchen loratadine (CLARITIN) 10 MG tablet Take 10 mg by mouth daily.    . meloxicam (MOBIC) 15 MG tablet Take 15 mg by mouth daily.    . modafinil (PROVIGIL) 200 MG tablet Take 1 tablet (200 mg total) by mouth daily. 30 tablet 5  . olmesartan-hydrochlorothiazide (BENICAR HCT) 40-25 MG tablet Take 1 tablet by mouth daily.     No current facility-administered medications for this visit.     Allergies as of 04/10/2017  . (No Known Allergies)    Vitals: BP (!) 176/110   Pulse 82   Resp 18   Ht  (1.727 m)   Wt 240 lb (108.9 kg)   BMI 36.49 kg/m  Last Weight:    Wt Readings from Last 1 Encounters:  04/10/17 240 lb (108.9 kg)   WUJ:WJXB mass index is 36.49 kg/m.     Last Height:   Ht Readings from Last 1 Encounters:  04/10/17  (1.727 m)    Physical exam:  General: The patient is awake, alert and appears not in acute distress. The patient is well groomed. Head: Normocephalic, atraumatic. Neck is supple. Mallampati 3  neck circumference:18. Nasal airflow patent,  Retrognathia is seen.  He has never worn a retainer or braces, he is all natural teeth. Cardiovascular:  Regular rate and rhythm , without  murmurs or carotid bruit, and without distended neck veins.  Respiratory: Lungs are clear to auscultation. Skin:  Without evidence of edema, or rash Trunk: BMI is 36. The patient's posture is erect  Neurologic exam : The patient is awake and alert, oriented to place and time.   Memory subjective described as intact.  Attention span & concentration ability appears normal.  Speech is fluent,  without dysarthria, dysphonia or aphasia.  Mood and affect are appropriate.  Cranial nerves: Pupils are equal and briskly reactive to light. He wears contacts.  Facial sensation intact to fine touch.Facial motor strength is symmetric and tongue and uvula move midline. Shoulder shrug was symmetrical.   Motor exam:  Normal tone, muscle bulk and symmetric strength in all extremities. I spent more than 20 minutes of face to face time with the patient. Greater than 50% of time was spent in counseling and coordination of care. We have discussed the diagnosis and differential and I answered the patient's questions.     Assessment:  After physical and neurologic examination, review of laboratory studies,  Personal review of imaging studies, reports of other /same  Imaging studies ,  Results of polysomnography/ neurophysiology testing and pre-existing records as far as provided in visit., my assessment is   1) Austin [name] has OSA with a strong REM accentuation. Due  to the high apnea index during REM sleep this patient was not considered a good candidate for a dental device. There was also a prolonged period of hypoxemia that precluded the use of a dental device and made CPAP the only therapeutic option.  2) Austin [name] primary sleep disorder is sleep deprivation due to rotating night shift work. He is unable to find restorative sleep in daytime following  3 days of night time work -after  night shifts he is usually very exhausted. He endorsed today the Epworth sleepiness score at 18 points- now reduced to 9 points under CPAP, . I advised him to drive carefully and make judgments and operating machinery due to daytime sleepiness. Since his sleepiness has been improved he can use modafinil as needed but probably doesn't need to use it daily. He continues to work night shifts and rotating shifts which in itself is a indication for the medication  3) Caffeine overuse-  corrected, drinks only 2 a day.  Plan:  Treatment plan and additional workup : I will re-order modafinil for this patient,  Rv in 12 month.     Porfirio Mylar Sydelle Sherfield MD  04/10/2017   CC: Arnette Felts, NP at [facility].

## 2017-04-25 DIAGNOSIS — G4733 Obstructive sleep apnea (adult) (pediatric): Secondary | ICD-10-CM | POA: Diagnosis not present

## 2017-05-25 DIAGNOSIS — G4733 Obstructive sleep apnea (adult) (pediatric): Secondary | ICD-10-CM | POA: Diagnosis not present

## 2017-07-20 ENCOUNTER — Other Ambulatory Visit: Payer: Self-pay | Admitting: Neurology

## 2017-07-20 DIAGNOSIS — G4719 Other hypersomnia: Secondary | ICD-10-CM

## 2017-07-20 DIAGNOSIS — G4733 Obstructive sleep apnea (adult) (pediatric): Secondary | ICD-10-CM

## 2017-07-20 DIAGNOSIS — G4726 Circadian rhythm sleep disorder, shift work type: Secondary | ICD-10-CM

## 2017-07-20 DIAGNOSIS — R0683 Snoring: Secondary | ICD-10-CM

## 2017-07-21 DIAGNOSIS — R7309 Other abnormal glucose: Secondary | ICD-10-CM | POA: Diagnosis not present

## 2017-07-21 DIAGNOSIS — G4733 Obstructive sleep apnea (adult) (pediatric): Secondary | ICD-10-CM | POA: Diagnosis not present

## 2017-07-21 DIAGNOSIS — I10 Essential (primary) hypertension: Secondary | ICD-10-CM | POA: Diagnosis not present

## 2017-07-21 DIAGNOSIS — G4726 Circadian rhythm sleep disorder, shift work type: Secondary | ICD-10-CM | POA: Diagnosis not present

## 2017-08-19 DIAGNOSIS — I10 Essential (primary) hypertension: Secondary | ICD-10-CM | POA: Diagnosis not present

## 2017-08-22 ENCOUNTER — Encounter: Payer: Self-pay | Admitting: Neurology

## 2017-08-22 ENCOUNTER — Telehealth: Payer: Self-pay | Admitting: *Deleted

## 2017-08-22 NOTE — Telephone Encounter (Signed)
NOTES SENT TO SCHEDULING.  °

## 2017-08-26 ENCOUNTER — Telehealth: Payer: Self-pay | Admitting: Cardiovascular Disease

## 2017-08-26 NOTE — Telephone Encounter (Signed)
Received records from [facility] for appointment on 09/17/17 with Dr Duke Salvia.  Records put with Dr Leonides Sake schedule for 09/17/17. lp

## 2017-09-01 ENCOUNTER — Other Ambulatory Visit: Payer: Self-pay | Admitting: Neurology

## 2017-09-01 DIAGNOSIS — R0683 Snoring: Secondary | ICD-10-CM

## 2017-09-01 DIAGNOSIS — G4726 Circadian rhythm sleep disorder, shift work type: Secondary | ICD-10-CM

## 2017-09-01 DIAGNOSIS — G4733 Obstructive sleep apnea (adult) (pediatric): Secondary | ICD-10-CM

## 2017-09-01 DIAGNOSIS — G4719 Other hypersomnia: Secondary | ICD-10-CM

## 2017-09-17 ENCOUNTER — Ambulatory Visit (INDEPENDENT_AMBULATORY_CARE_PROVIDER_SITE_OTHER): Payer: BLUE CROSS/BLUE SHIELD | Admitting: Cardiovascular Disease

## 2017-09-17 ENCOUNTER — Encounter: Payer: Self-pay | Admitting: Cardiovascular Disease

## 2017-09-17 VITALS — BP 135/91 | HR 68 | Ht 67.5 in | Wt 242.0 lb

## 2017-09-17 DIAGNOSIS — I10 Essential (primary) hypertension: Secondary | ICD-10-CM

## 2017-09-17 DIAGNOSIS — Z5181 Encounter for therapeutic drug level monitoring: Secondary | ICD-10-CM

## 2017-09-17 DIAGNOSIS — R9431 Abnormal electrocardiogram [ECG] [EKG]: Secondary | ICD-10-CM | POA: Diagnosis not present

## 2017-09-17 DIAGNOSIS — I1A0 Resistant hypertension: Secondary | ICD-10-CM

## 2017-09-17 MED ORDER — IRBESARTAN 300 MG PO TABS: 300.0000 mg | ORAL_TABLET | Freq: Every day | ORAL | 5 refills | Status: DC

## 2017-09-17 MED ORDER — CHLORTHALIDONE 25 MG PO TABS: 25.0000 mg | ORAL_TABLET | Freq: Every day | ORAL | 5 refills | Status: DC

## 2017-09-17 MED ORDER — CARVEDILOL 12.5 MG PO TABS: 12.5000 mg | ORAL_TABLET | Freq: Two times a day (BID) | ORAL | 5 refills | Status: DC

## 2017-09-17 NOTE — Progress Notes (Signed)
Cardiology Office Note   Date:  09/18/2017   ID:  [name], DOB November 13, 1981, MRN [id number]  PCP:  Dorothyann Peng, MD  Cardiologist:   Chilton Si, MD   No chief complaint on file.     History of Present Illness: Austin [name] is a 36 y.o. male with OSA, hypertension and morbid obesity who presents for evaluation of hypertension.  [name] saw Dr. Dorothyann Peng and has been working to control his hypertension.  He was most recently switched from Benicar to [place] with some improvement in his BP.  It has been mostly in the 140s/90s but was 220/110 two weeks ago.  He was referred to cardiology for further evaluation.  He was initially diagnosed with hypertension in high school and it has never been very well-controlled.  He has OSA and wears his CPAP faithfully.  He denies lower extremity edema, orthopnea, or PND. He started cycling 3 days per week and has no chest pain or shortness of breath with this activity. 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   Past Medical History:  Diagnosis Date  . Essential hypertension 09/18/2017  . Hypertension     Past Surgical History:  Procedure Laterality Date  . KNEE ARTHROSCOPY WITH ANTERIOR CRUCIATE LIGAMENT (ACL) REPAIR       Current Outpatient Prescriptions  Medication Sig Dispense Refill  . amLODipine (NORVASC) 10 MG tablet Take 10 mg by mouth daily.    . fluticasone (FLONASE) 50 MCG/ACT nasal spray Place into both nostrils daily.    . hydrALAZINE (APRESOLINE) 25 MG tablet Take 1 tablet by mouth daily.  2  . loratadine (CLARITIN) 10 MG tablet Take 10 mg by mouth daily.    Marland Kitchen MAGNESIUM CITRATE PO Take 500 mg by mouth daily.    . modafinil (PROVIGIL) 200 MG tablet TAKE 1 TABLET BY MOUTH DAILY 30 tablet 5  . carvedilol (COREG) 12.5 MG tablet Take 1 tablet (12.5 mg total) by mouth 2 (two) times daily. 60 tablet 5  . chlorthalidone (HYGROTON) 25 MG tablet Take 1 tablet (25 mg total) by mouth daily. 30 tablet 5  . irbesartan (AVAPRO)  300 MG tablet Take 1 tablet (300 mg total) by mouth daily. 30 tablet 5   No current facility-administered medications for this visit.     Allergies:   Patient has no known allergies.    Social History:  The patient  reports that he has never smoked. He has never used smokeless tobacco. He reports that he drinks about 1.2 oz of alcohol per week . He reports that he does not use drugs.   Family History:  The patient's family history includes Diabetes in his mother; Hypertension in his maternal grandmother; Stroke in his maternal grandmother.    ROS:  Please see the history of present illness.   Otherwise, review of systems are positive for none.   All other systems are reviewed and negative.    PHYSICAL EXAM: VS:  BP (!) 135/91 (BP Location: Right Arm, Cuff Size: Large)   Pulse 68   Ht 5' 7.5" (1.715 m)   Wt 109.8 kg (242 lb)   BMI 37.34 kg/m  , BMI Body mass index is 37.34 kg/m. GENERAL:  Well appearing HEENT:  Pupils equal round and reactive, fundi not visualized, oral mucosa unremarkable NECK:  No jugular venous distention, waveform within normal limits, carotid upstroke brisk and symmetric, no bruits, no thyromegaly LYMPHATICS:  No cervical adenopathy LUNGS:  Clear to auscultation bilaterally HEART:  RRR.  PMI not displaced or sustained,S1 and S2 within normal limits, no S3, no S4, no clicks, no rubs, no murmurs ABD:  Flat, positive bowel sounds normal in frequency in pitch, no bruits, no rebound, no guarding, no midline pulsatile mass, no hepatomegaly, no splenomegaly EXT:  2 plus pulses throughout, no edema, no cyanosis no clubbing SKIN:  No rashes no nodules NEURO:  Cranial nerves II through XII grossly intact, motor grossly intact throughout PSYCH:  Cognitively intact, oriented to person place and time    EKG:  EKG is ordered today. The ekg ordered today demonstrates sinus rhythm.  Rate 65 bpm.  LVH with repolarization abnormalities.  LAFB.     Recent Labs: No results  found for requested labs within last 8760 hours.   07/21/17: Sodium 142, potassium 3.4, BUN 16, creatinine 1.18 AST 25, ALT 41 Hemoglobin A1c 6.0% Magnesium 2.1  Lipid Panel No results found for: CHOL, TRIG, HDL, CHOLHDL, VLDL, LDLCALC, LDLDIRECT    Wt Readings from Last 3 Encounters:  09/17/17 109.8 kg (242 lb)  04/10/17 108.9 kg (240 lb)  11/27/16 107 kg (236 lb)      ASSESSMENT AND PLAN:  # Hypertension: P is nearly controlled.  He is currently taking Edarybclor, which has been helpful but is very expensive.  We will switch this to Irbesartan  and chlorthalidone  daily.  Stop metoprolol and start carvedilol 12.5mg  bid.  Continue amlodipine and hydralazine.  He will continue to check his BP.  BMP in 1 week.  We will get a renal artery Doppler to assess for RAS and an echo to evaluate for LVH.  # LVH with repolarization abnormality: Echo.  No evidence of hear failure on exam.   Current medicines are reviewed at length with the patient today.  The patient does not have concerns regarding medicines.  The following changes have been made:  no change  Labs/ tests ordered today include:   Orders Placed This Encounter  Procedures  . Basic metabolic panel  . EKG 12-Lead  . ECHOCARDIOGRAM COMPLETE     Disposition:   FU with Shary Lamos C. Duke Salvia, MD, [facility] in 6 months.  PharmD in 1 month.    This note was written with the assistance of speech recognition software.  Please excuse any transcriptional errors.  Signed, [name], MD, [facility]  09/18/2017 7:26 PM    [facility]

## 2017-09-17 NOTE — Patient Instructions (Addendum)
Medication Instructions:  STOP METOPROLOL   STOP EDARBY   START IRBESARTAN 300 MG DAILY   START CHLORTHALIDONE 25 MG DAILY   START CARVEDILOL 12.5 MG TWICE A DAY   Labwork: BMET IN 1 WEEK   Testing/Procedures: Your physician has requested that you have an echocardiogram. Echocardiography is a painless test that uses sound waves to create images of your heart. It provides your doctor with information about the size and shape of your heart and how well your heart's chambers and valves are working. This procedure takes approximately one hour. There are no restrictions for this procedure. [facility] AT [address]  Your physician has requested that you have a renal artery duplex. During this test, an ultrasound is used to evaluate blood flow to the kidneys. Allow one hour for this exam. Do not eat after midnight the day before and avoid carbonated beverages. Take your medications as you usually do.  Follow-Up: Your physician recommends that you schedule a follow-up appointment in: 1 MONTH WITH PHARM D FOR BLOOD PRESSURE  Your physician wants you to follow-up in: 6 MONTH OV WITH DR [facility]  You will receive a reminder letter in the mail two months in advance. If you don't receive a letter, please call our office to schedule the follow-up appointment.  If you need a refill on your cardiac medications before your next appointment, please call your pharmacy.

## 2017-09-18 ENCOUNTER — Encounter: Payer: Self-pay | Admitting: Cardiovascular Disease

## 2017-09-18 DIAGNOSIS — I10 Essential (primary) hypertension: Secondary | ICD-10-CM | POA: Insufficient documentation

## 2017-09-18 HISTORY — DX: Essential (primary) hypertension: I10

## 2017-09-25 ENCOUNTER — Other Ambulatory Visit: Payer: Self-pay

## 2017-09-25 ENCOUNTER — Ambulatory Visit (HOSPITAL_COMMUNITY): Payer: BLUE CROSS/BLUE SHIELD | Attending: Cardiology

## 2017-09-25 DIAGNOSIS — G4733 Obstructive sleep apnea (adult) (pediatric): Secondary | ICD-10-CM | POA: Insufficient documentation

## 2017-09-25 DIAGNOSIS — Z6837 Body mass index (BMI) 37.0-37.9, adult: Secondary | ICD-10-CM | POA: Insufficient documentation

## 2017-09-25 DIAGNOSIS — I119 Hypertensive heart disease without heart failure: Secondary | ICD-10-CM | POA: Insufficient documentation

## 2017-09-25 DIAGNOSIS — I10 Essential (primary) hypertension: Secondary | ICD-10-CM | POA: Diagnosis not present

## 2017-09-25 DIAGNOSIS — R9431 Abnormal electrocardiogram [ECG] [EKG]: Secondary | ICD-10-CM

## 2017-10-02 DIAGNOSIS — G4733 Obstructive sleep apnea (adult) (pediatric): Secondary | ICD-10-CM | POA: Diagnosis not present

## 2017-10-03 ENCOUNTER — Ambulatory Visit (HOSPITAL_COMMUNITY)
Admission: RE | Admit: 2017-10-03 | Discharge: 2017-10-03 | Disposition: A | Discharge status: Home / Self Care | Payer: BLUE CROSS/BLUE SHIELD | Source: Ambulatory Visit | Attending: Cardiology | Admitting: Cardiology

## 2017-10-03 DIAGNOSIS — R9431 Abnormal electrocardiogram [ECG] [EKG]: Secondary | ICD-10-CM

## 2017-10-03 DIAGNOSIS — I1A0 Resistant hypertension: Secondary | ICD-10-CM

## 2017-10-03 DIAGNOSIS — I10 Essential (primary) hypertension: Secondary | ICD-10-CM | POA: Diagnosis not present

## 2017-10-14 DIAGNOSIS — Z23 Encounter for immunization: Secondary | ICD-10-CM | POA: Diagnosis not present

## 2017-10-14 DIAGNOSIS — I10 Essential (primary) hypertension: Secondary | ICD-10-CM | POA: Diagnosis not present

## 2017-10-14 DIAGNOSIS — Z79899 Other long term (current) drug therapy: Secondary | ICD-10-CM | POA: Diagnosis not present

## 2017-10-23 ENCOUNTER — Encounter: Payer: Self-pay | Admitting: Pharmacist Clinician (PhC)/ Clinical Pharmacy Specialist

## 2017-10-23 ENCOUNTER — Ambulatory Visit (INDEPENDENT_AMBULATORY_CARE_PROVIDER_SITE_OTHER): Payer: BLUE CROSS/BLUE SHIELD | Admitting: Pharmacist Clinician (PhC)/ Clinical Pharmacy Specialist

## 2017-10-23 VITALS — BP 146/84 | HR 72 | Ht 68.0 in | Wt 245.0 lb

## 2017-10-23 DIAGNOSIS — I10 Essential (primary) hypertension: Secondary | ICD-10-CM | POA: Diagnosis not present

## 2017-10-23 DIAGNOSIS — Z5181 Encounter for therapeutic drug level monitoring: Secondary | ICD-10-CM | POA: Diagnosis not present

## 2017-10-23 LAB — BASIC METABOLIC PANEL
BUN / CREAT RATIO: 15 (ref 9–20)
BUN: 16 mg/dL (ref 6–20)
CO2: 27 mmol/L (ref 20–29)
CREATININE: 1.08 mg/dL (ref 0.76–1.27)
Calcium: 9.5 mg/dL (ref 8.7–10.2)
Chloride: 100 mmol/L (ref 96–106)
GFR calc Af Amer: 100 mL/min/{1.73_m2} (ref >59)
GFR, EST NON AFRICAN AMERICAN: 87 mL/min/{1.73_m2} (ref >59)
Glucose: 87 mg/dL (ref 65–99)
POTASSIUM: 3.5 mmol/L (ref 3.5–5.2)
SODIUM: 142 mmol/L (ref 134–144)

## 2017-10-23 LAB — TSH: TSH: 1.56 u[IU]/mL (ref 0.450–4.500)

## 2017-10-23 MED ORDER — HYDRALAZINE HCL 50 MG PO TABS: 50.0000 mg | ORAL_TABLET | Freq: Two times a day (BID) | ORAL | 5 refills | Status: DC

## 2017-10-23 NOTE — Patient Instructions (Addendum)
Return for a a follow up appointment in 6 weeks  Your blood pressure today is 146/84  Check your blood pressure at home daily and keep record of the readings.  Take your BP meds as follows:  Increase hydralazine to 50 mg twice daily  Continue all other medications  Bring all of your meds, your BP cuff and your record of home blood pressures to your next appointment.  Exercise as you're able, try to walk approximately 30 minutes per day.  Keep salt intake to a minimum, especially watch canned and prepared boxed foods.  Eat more fresh fruits and vegetables and fewer canned items.  Avoid eating in fast food restaurants.    HOW TO TAKE YOUR BLOOD PRESSURE: . Rest 5 minutes before taking your blood pressure. .  Don't smoke or drink caffeinated beverages for at least 30 minutes before. . Take your blood pressure before (not after) you eat. . Sit comfortably with your back supported and both feet on the floor (don't cross your legs). . Elevate your arm to heart level on a table or a desk. . Use the proper sized cuff. It should fit smoothly and snugly around your bare upper arm. There should be enough room to slip a fingertip under the cuff. The bottom edge of the cuff should be 1 inch above the crease of the elbow. . Ideally, take 3 measurements at one sitting and record the average.   \

## 2017-10-23 NOTE — Assessment & Plan Note (Signed)
Patient with resistant hypertension, currently on maximum doses of 4 medications and lower dose of fifth.  Self reported pressures are improving, but still not to goal.  Will have him increase the hydralazine from 25 mg daily to 50 mg bid and continue home BP monitoring.  He was asked to bring his home cuff to next visit or have co-worker calibrate for him.  He will return in 6 weeks for follow up.  Looking at secondary causes of hypertension, his echo and Renal dopplers were WNL.  Will have him draw BMET that Dr. Duke Salviaandolph previously ordered as well as a TSH today, as he believes nobody has ever checked his thyroid level.  Can consider adding spironolactone if needed, although patient not eager to increase his pill burden.

## 2017-10-23 NOTE — Progress Notes (Signed)
10/23/2017 Keldon Lassen Fedorko 08/15/1981 [id number]   HPI:  Austin [name] is a 36 y.o. male patient of Dr Duke Salvia, with a PMH below who presents today for hypertension clinic evaluation.  Patient reports having hypertension since he was a teenager, and that it has never been well controlled.  When he saw Dr. Duke Salvia in September his pressure was improving, at 135/91, but patient reported that Grace Bushy was cost prohibitive.  She switched him to irbesartan 300 mg and chlorthalidone 25 mg.  She has also scheduled him for echo and renal dopplers - both of these studies came back normal.  He does have OSA for which he uses CPAP.  Unfortunately because of his job, he does not have a regular sleep pattern.  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  He has no complaints or concerns about his medications, other than the frustration of being on 5 meds and still not well controlled.    Cardiac history:  Hypertension, OSA (treated with CPAP)   Blood Pressure Goal:  130/80   Current Medications:  Irbesartan 30 mg qd - am  Chlorthalidone 25 mg qd - am  Amlodipine 10 mg qd - pm  Carvedilol 12.5 mg bid  Hydralazine 25 mg qd - am  Family Hx:  Grandmother (maternal) with hypertension now 13  Parents and 2 siblings - no cardiac health issues or hypertension  Social Hx:  No tobacco, occasional alcohol, coffee throughout night with work  Diet:  Avoids fast food; no added salts no canned or processed foods, usually takes meals with him to work  Exercise:  No regular exercise  Home BP readings:  Home cuff as well as checking at work - since med changes running 140/90 range - (below 150 systolic, no disastolic > 100).    Intolerances:   nkda  CrCl cannot be calculated (Patient's most recent lab result is older than the maximum 21 days allowed.).  Wt Readings from Last 3 Encounters:  10/23/17 245 lb (111.1 kg)  09/17/17 242 lb (109.8 kg)    04/10/17 240 lb (108.9 kg)   BP Readings from Last 3 Encounters:  10/23/17 (!) 146/84  09/17/17 (!) 135/91  04/10/17 (!) 176/110   Pulse Readings from Last 3 Encounters:  10/23/17 72  09/17/17 68  04/10/17 82    Current Outpatient Prescriptions  Medication Sig Dispense Refill  . amLODipine (NORVASC) 10 MG tablet Take 10 mg by mouth daily.    . carvedilol (COREG) 12.5 MG tablet Take 1 tablet (12.5 mg total) by mouth 2 (two) times daily. 60 tablet 5  . chlorthalidone (HYGROTON) 25 MG tablet Take 1 tablet (25 mg total) by mouth daily. 30 tablet 5  . fluticasone (FLONASE) 50 MCG/ACT nasal spray Place into both nostrils daily.    . hydrALAZINE (APRESOLINE) 50 MG tablet Take 1 tablet (50 mg total) by mouth 2 (two) times daily. 60 tablet 5  . irbesartan (AVAPRO) 300 MG tablet Take 1 tablet (300 mg total) by mouth daily. 30 tablet 5  . loratadine (CLARITIN) 10 MG tablet Take 10 mg by mouth daily.    Marland Kitchen MAGNESIUM CITRATE PO Take 500 mg by mouth daily.    . modafinil (PROVIGIL) 200 MG tablet TAKE 1 TABLET BY MOUTH DAILY 30 tablet 5   No current facility-administered medications for this visit.     No Known Allergies  Past Medical History:  Diagnosis Date  .  Essential hypertension 09/18/2017  . Hypertension    Standing BP 150/90  Blood pressure (!) 146/84, pulse 72, height 5\' 8"  (1.727 m), weight 245 lb (111.1 kg).  Essential hypertension Patient with resistant hypertension, currently on maximum doses of 4 medications and lower dose of fifth.  Self reported pressures are improving, but still not to goal.  Will have him increase the hydralazine from 25 mg daily to 50 mg bid and continue home BP monitoring.  He was asked to bring his home cuff to next visit or have co-worker calibrate for him.  He will return in 6 weeks for follow up.  Looking at secondary causes of hypertension, his echo and Renal dopplers were WNL.  Will have him draw BMET that Dr. Duke Salviaandolph previously ordered as well as  a TSH today, as he believes nobody has ever checked his thyroid level.  Can consider adding spironolactone if needed, although patient not eager to increase his pill burden.   Phillips HayKristin Lew Prout PharmD CPP [facility] [address] [place], [place] [address] [phone]

## 2017-12-02 ENCOUNTER — Ambulatory Visit (INDEPENDENT_AMBULATORY_CARE_PROVIDER_SITE_OTHER): Payer: BLUE CROSS/BLUE SHIELD | Admitting: Pharmacist Clinician (PhC)/ Clinical Pharmacy Specialist

## 2017-12-02 VITALS — BP 136/88 | HR 76

## 2017-12-02 DIAGNOSIS — I10 Essential (primary) hypertension: Secondary | ICD-10-CM | POA: Diagnosis not present

## 2017-12-02 MED ORDER — SPIRONOLACTONE 25 MG PO TABS: 25.0000 mg | ORAL_TABLET | Freq: Every day | ORAL | 5 refills | Status: DC

## 2017-12-02 NOTE — Assessment & Plan Note (Signed)
Patient is a 36 year old with essential hypertension, on CPAP.  Secondary causes of renal artery stenosis, thyroid dysfunction and CKD have been ruled out.   Because of no change in BP readings with increase of hydralazine dose, will stop that and switch him to spironolactone 25 mg.   He is to repeat BMET in 2 weeks and call after 3-4 weeks if no significant changes in his home readings.

## 2017-12-02 NOTE — Patient Instructions (Addendum)
Call in 4-5 weeks and let us know how your blood pressure is doing.  [phone] Austen Oyster/Raquel   Your blood pressure today is 136/88  Check your blood pressure at home daily and keep record of the readings.  Take your BP meds as follows:  Stop hydralazine  Start spironolactone 25 mg once daily in the evenings  Continue all other medications  Go to the lab in about 2 weeks and have your blood checked.  We will want to be sure your potassium level was not affected by the medication.  Bring all of your meds, your BP cuff and your record of home blood pressures to your next appointment.  Exercise as you're able, try to walk approximately 30 minutes per day.  Keep salt intake to a minimum, especially watch canned and prepared boxed foods.  Eat more fresh fruits and vegetables and fewer canned items.  Avoid eating in fast food restaurants.    HOW TO TAKE YOUR BLOOD PRESSURE: . Rest 5 minutes before taking your blood pressure. .  Don't smoke or drink caffeinated beverages for at least 30 minutes before. . Take your blood pressure before (not after) you eat. . Sit comfortably with your back supported and both feet on the floor (don't cross your legs). . Elevate your arm to heart level on a table or a desk. . Use the proper sized cuff. It should fit smoothly and snugly around your bare upper arm. There should be enough room to slip a fingertip under the cuff. The bottom edge of the cuff should be 1 inch above the crease of the elbow. . Ideally, take 3 measurements at one sitting and record the average.
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
## 2017-12-26 ENCOUNTER — Encounter: Payer: Self-pay | Admitting: Pharmacist Clinician (PhC)/ Clinical Pharmacy Specialist

## 2018-03-13 ENCOUNTER — Other Ambulatory Visit: Payer: Self-pay | Admitting: Cardiovascular Disease

## 2018-03-13 MED ORDER — SPIRONOLACTONE 25 MG PO TABS: 25.0000 mg | ORAL_TABLET | Freq: Every day | ORAL | 5 refills | Status: DC

## 2018-03-13 MED ORDER — CARVEDILOL 12.5 MG PO TABS: 12.5000 mg | ORAL_TABLET | Freq: Two times a day (BID) | ORAL | 5 refills | Status: DC

## 2018-04-06 ENCOUNTER — Other Ambulatory Visit: Payer: Self-pay | Admitting: Cardiovascular Disease

## 2018-04-06 ENCOUNTER — Other Ambulatory Visit: Payer: Self-pay | Admitting: Neurology

## 2018-04-06 DIAGNOSIS — G4733 Obstructive sleep apnea (adult) (pediatric): Secondary | ICD-10-CM

## 2018-04-06 DIAGNOSIS — G4719 Other hypersomnia: Secondary | ICD-10-CM

## 2018-04-06 DIAGNOSIS — R0683 Snoring: Secondary | ICD-10-CM

## 2018-04-06 DIAGNOSIS — G4726 Circadian rhythm sleep disorder, shift work type: Secondary | ICD-10-CM

## 2018-04-07 ENCOUNTER — Other Ambulatory Visit: Payer: Self-pay | Admitting: Neurology

## 2018-04-07 DIAGNOSIS — G4726 Circadian rhythm sleep disorder, shift work type: Secondary | ICD-10-CM

## 2018-04-07 DIAGNOSIS — G4719 Other hypersomnia: Secondary | ICD-10-CM

## 2018-04-07 DIAGNOSIS — R0683 Snoring: Secondary | ICD-10-CM

## 2018-04-07 DIAGNOSIS — G4733 Obstructive sleep apnea (adult) (pediatric): Secondary | ICD-10-CM

## 2018-04-07 MED ORDER — MODAFINIL 200 MG PO TABS: 200.0000 mg | ORAL_TABLET | Freq: Every day | ORAL | 0 refills | Status: DC

## 2018-04-07 NOTE — Telephone Encounter (Signed)
Please review for refill, Thanks !  

## 2018-04-07 NOTE — Telephone Encounter (Signed)
REFILL 

## 2018-04-11 ENCOUNTER — Encounter: Payer: Self-pay | Admitting: Adult Health

## 2018-04-13 ENCOUNTER — Encounter: Payer: Self-pay | Admitting: Adult Health

## 2018-04-13 ENCOUNTER — Ambulatory Visit: Payer: BLUE CROSS/BLUE SHIELD | Admitting: Adult Health

## 2018-04-13 VITALS — BP 166/100 | HR 74 | Ht 68.0 in | Wt 251.0 lb

## 2018-04-13 DIAGNOSIS — Z9989 Dependence on other enabling machines and devices: Secondary | ICD-10-CM | POA: Diagnosis not present

## 2018-04-13 DIAGNOSIS — G4719 Other hypersomnia: Secondary | ICD-10-CM | POA: Diagnosis not present

## 2018-04-13 DIAGNOSIS — G4726 Circadian rhythm sleep disorder, shift work type: Secondary | ICD-10-CM

## 2018-04-13 DIAGNOSIS — G4733 Obstructive sleep apnea (adult) (pediatric): Secondary | ICD-10-CM | POA: Diagnosis not present

## 2018-04-13 MED ORDER — MODAFINIL 200 MG PO TABS: 200.0000 mg | ORAL_TABLET | Freq: Every day | ORAL | 5 refills | Status: DC

## 2018-04-13 NOTE — Patient Instructions (Signed)
Your Plan:  Continue using CPAP nightly May have to set an alarm after 4 hours to put her machine back on.  If your symptoms worsen or you develop new symptoms please let us know.  Thank you for coming to see [facility] at [facility]. I hope we have been able to provide you high quality care today.  You may receive a patient satisfaction survey over the next few weeks. We would appreciate your feedback and comments so that we may continue to improve ourselves and the health of our patients.

## 2018-04-13 NOTE — Progress Notes (Signed)
PATIENT: Austin [name] DOB: February 03, 1981  REASON FOR VISIT: follow up HISTORY FROM: patient  HISTORY OF PRESENT ILLNESS: Today 04/13/18 Austin [name] is a 37 year old male with a history of obstructive sleep apnea on CPAP.  His CPAP download indicates that he use his machine 27 out of 30 days for compliance of 90%.  He uses machine greater than 4 hours 8 out of 30 days for compliance of 27%.  On average he uses his machine 3 hours and 37 minutes.  His residual AHI is 1.5 on 9 cmH20 with EPR 3.  He does not have a significant leak.  He states that in the mornings he will wake up and the mask will be off his face laying on his pillow.  He states that he does not recall taking the mask off during the night.  He states that he tends to do better using the mask when he sleeps at work as he has to sleep in a recliner and is not moving around.  He continues to take Provigil daily.  He returns today for evaluation.  HISTORY (Copied from Dr. Oliva Bustard note): Austin [name] is a 37 y.o. male , seen here as a referral from NP Arnette Felts  for a sleep consultation, Mr. stoutenburg is seen here today on 04/10/2017 following a diagnostic polysomnography which was performed on 12/19/2016 which revealed mild apnea at 12.2 AHI but accentuated by REM sleep to 24.8,. Interestingly nonsupine sleep produced an higher apnea index than when he slept on his back. Nonsupine AHI was 18.3. The patient also had prolonged periods of oxygen desaturation with a total of 84 minutes under 89% saturation. He did not have any periodic limb movements. The study was followed by a CPAP titration during which a pressure of 9 cm water pressure and deviated the apnea. The patient used an air-fit nasal pillow in medium size. The compliance report shows that the patient has used the machine 29 out of 30 days but still struggles to use it 4 hours each night. Some nights I have only 3 or barely 4 hours of use. He reports that he sometimes loses  contact with the mask. The average user time is 4 hours and 52 minutes, the machine is set at 9 cm with 3 cm EPR and a residual AHI of 1.3 is achieved. He does have very few air leaks.  [employer] gave him a different mask , Dreamwear mask and he likes it better. This explains the improving trend since March 24.  The patient feels not as drowsy during the day, his blood pressure is controlled on the current medications. The patient is not diabetic.    REVIEW OF SYSTEMS: Out of a complete 14 system review of symptoms, the patient complains only of the following symptoms, and all other reviewed systems are negative.  Chills, eye discharge, daytime sleepiness, snoring, shift work ESS11 FSS 15  ALLERGIES: No Known Allergies  HOME MEDICATIONS: Outpatient Medications Prior to Visit  Medication Sig Dispense Refill  . amLODipine (NORVASC) 10 MG tablet Take 10 mg by mouth daily.    . carvedilol (COREG) 12.5 MG tablet Take 1 tablet (12.5 mg total) by mouth 2 (two) times daily. 60 tablet 5  . chlorthalidone (HYGROTON) 25 MG tablet Take 1 tablet (25 mg total) by mouth daily. NEED OV. 30 tablet 2  . fluticasone (FLONASE) 50 MCG/ACT nasal spray Place into both nostrils daily.    . irbesartan (AVAPRO) 300 MG tablet Take 1 tablet (  300 mg total) by mouth daily. NEED OV. 30 tablet 2  . loratadine (CLARITIN) 10 MG tablet Take 10 mg by mouth daily.    Marland Kitchen. MAGNESIUM CITRATE PO Take 500 mg by mouth daily.    . modafinil (PROVIGIL) 200 MG tablet Take 1 tablet (200 mg total) by mouth daily. 30 tablet 0  . spironolactone (ALDACTONE) 25 MG tablet Take 1 tablet (25 mg total) by mouth daily. 30 tablet 5  . hydrALAZINE (APRESOLINE) 50 MG tablet Take 1 tablet (50 mg total) by mouth 2 (two) times daily. 60 tablet 5   No facility-administered medications prior to visit.     PAST MEDICAL HISTORY: Past Medical History:  Diagnosis Date  . Essential hypertension 09/18/2017  . Hypertension     PAST SURGICAL  HISTORY: Past Surgical History:  Procedure Laterality Date  . KNEE ARTHROSCOPY WITH ANTERIOR CRUCIATE LIGAMENT (ACL) REPAIR      FAMILY HISTORY: Family History  Problem Relation Age of Onset  . Diabetes Mother   . Hypertension Maternal Grandmother   . Stroke Maternal Grandmother     SOCIAL HISTORY: Social History   Socioeconomic History  . Marital status: Married    Spouse name: Not on file  . Number of children: Not on file  . Years of education: Not on file  . Highest education level: Not on file  Occupational History  . Not on file  Social Needs  . Financial resource strain: Not on file  . Food insecurity:    Worry: Not on file    Inability: Not on file  . Transportation needs:    Medical: Not on file    Non-medical: Not on file  Tobacco Use  . Smoking status: Never Smoker  . Smokeless tobacco: Never Used  Substance and Sexual Activity  . Alcohol use: Yes    Alcohol/week: 1.2 oz    Types: 2 Standard drinks or equivalent per week  . Drug use: No  . Sexual activity: Not on file  Lifestyle  . Physical activity:    Days per week: Not on file    Minutes per session: Not on file  . Stress: Not on file  Relationships  . Social connections:    Talks on phone: Not on file    Gets together: Not on file    Attends religious service: Not on file    Active member of club or organization: Not on file    Attends meetings of clubs or organizations: Not on file    Relationship status: Not on file  . Intimate partner violence:    Fear of current or ex partner: Not on file    Emotionally abused: Not on file    Physically abused: Not on file    Forced sexual activity: Not on file  Other Topics Concern  . Not on file  Social History Narrative  . Not on file      PHYSICAL EXAM  Vitals:   04/13/18 1048  BP: (!) 166/100  Pulse: 74  Weight: 251 lb (113.9 kg)  Height: 5\' 8"  (1.727 m)   Body mass index is 38.16 kg/m.  Generalized: Well developed, in no acute  distress   Neurological examination  Mentation: Alert oriented to time, place, history taking. Follows all commands speech and language fluent Cranial nerve II-XII: Pupils were equal round reactive to light. Extraocular movements were full, visual field were full on confrontational test. Facial sensation and strength were normal. Uvula tongue midline. Head turning and shoulder shrug  were normal and symmetric.  Neck circumference 18-1/2 inches Mallampati 4+ Motor: The motor testing reveals 5 over 5 strength of all 4 extremities. Good symmetric motor tone is noted throughout.  Sensory: Sensory testing is intact to soft touch on all 4 extremities. No evidence of extinction is noted.  Coordination: Cerebellar testing reveals good finger-nose-finger and heel-to-shin bilaterally.  Gait and station: Gait is normal. Tandem gait is normal. Romberg is negative. No drift is seen.  Reflexes: Deep tendon reflexes are symmetric and normal bilaterally.   DIAGNOSTIC DATA (LABS, IMAGING, TESTING) - I reviewed patient records, labs, notes, testing and imaging myself where available.  Lab Results  Component Value Date   WBC 8.9 07/10/2013   HGB 13.3 07/10/2013   HCT 42.1 07/10/2013   MCV 79.7 07/10/2013   PLT 135 (L) 07/10/2013      Component Value Date/Time   NA 142 10/23/2017 1028   K 3.5 10/23/2017 1028   CL 100 10/23/2017 1028   CO2 27 10/23/2017 1028   GLUCOSE 87 10/23/2017 1028   GLUCOSE 110 (H) 07/10/2013 2100   BUN 16 10/23/2017 1028   CREATININE 1.08 10/23/2017 1028   CALCIUM 9.5 10/23/2017 1028   PROT 7.5 07/10/2013 2100   ALBUMIN 3.6 07/10/2013 2100   AST 31 07/10/2013 2100   ALT 29 07/10/2013 2100   ALKPHOS 76 07/10/2013 2100   BILITOT 0.2 (L) 07/10/2013 2100   GFRNONAA 87 10/23/2017 1028   GFRAA 100 10/23/2017 1028    Lab Results  Component Value Date   TSH 1.560 10/23/2017      ASSESSMENT AND PLAN 37 y.o. year old male  has a past medical history of Essential  hypertension (09/18/2017) and Hypertension. here with:  1.  Obstructive sleep apnea on CPAP 2.  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  The patient does not have a significant leak therefore changing to a different mask most likely will not offer him much benefit.  I advised the patient that he could try to set an alarm after 4 hours to alert him to put his mask back on.  Patient voiced understanding.  Advised that if his symptoms worsen or he develop new symptoms he should let us know.  He will follow-up in 6 months or sooner if needed.  I spent 15 minutes with the patient. 50% of this time was spent reviewing his CPAP download   Butch Penny, MSN, NP-C 04/13/2018, 10:50 AM [facility] [address], [address] [place], [place] [address] [phone]

## 2018-05-03 NOTE — Progress Notes (Signed)
I agree with the assessment and plan as directed by NP .The patient is known to me .   Randell Detter, MD  

## 2018-07-04 ENCOUNTER — Other Ambulatory Visit: Payer: Self-pay | Admitting: Cardiovascular Disease

## 2018-07-06 NOTE — Telephone Encounter (Signed)
Rx sent to pharmacy   

## 2018-08-18 DIAGNOSIS — G4733 Obstructive sleep apnea (adult) (pediatric): Secondary | ICD-10-CM | POA: Diagnosis not present

## 2018-09-03 ENCOUNTER — Other Ambulatory Visit: Payer: Self-pay | Admitting: Cardiovascular Disease

## 2018-10-03 ENCOUNTER — Other Ambulatory Visit: Payer: Self-pay | Admitting: Cardiovascular Disease

## 2018-10-05 ENCOUNTER — Other Ambulatory Visit: Payer: Self-pay | Admitting: Internal Medicine

## 2018-10-05 ENCOUNTER — Other Ambulatory Visit: Payer: Self-pay | Admitting: Nurse Practitioner

## 2018-10-31 ENCOUNTER — Other Ambulatory Visit: Payer: Self-pay | Admitting: Cardiovascular Disease

## 2018-11-02 NOTE — Telephone Encounter (Signed)
Rx(s) sent to pharmacy electronically.  

## 2018-11-04 ENCOUNTER — Other Ambulatory Visit: Payer: Self-pay | Admitting: Adult Health

## 2018-11-04 DIAGNOSIS — G4726 Circadian rhythm sleep disorder, shift work type: Secondary | ICD-10-CM

## 2018-11-04 DIAGNOSIS — G4719 Other hypersomnia: Secondary | ICD-10-CM

## 2018-11-06 ENCOUNTER — Other Ambulatory Visit: Payer: Self-pay | Admitting: Cardiovascular Disease

## 2018-11-09 NOTE — Telephone Encounter (Signed)
Rx has been sent to the pharmacy electronically. ° °

## 2018-11-22 ENCOUNTER — Other Ambulatory Visit: Payer: Self-pay | Admitting: Cardiovascular Disease

## 2018-11-27 ENCOUNTER — Other Ambulatory Visit: Payer: Self-pay | Admitting: Cardiovascular Disease

## 2018-12-04 ENCOUNTER — Other Ambulatory Visit: Payer: Self-pay | Admitting: Cardiovascular Disease

## 2018-12-07 ENCOUNTER — Other Ambulatory Visit: Payer: Self-pay | Admitting: Cardiovascular Disease

## 2018-12-07 NOTE — Telephone Encounter (Signed)
Rx request sent to pharmacy.  

## 2018-12-13 ENCOUNTER — Other Ambulatory Visit: Payer: Self-pay | Admitting: Cardiovascular Disease

## 2018-12-21 ENCOUNTER — Other Ambulatory Visit: Payer: Self-pay | Admitting: Cardiovascular Disease

## 2018-12-24 ENCOUNTER — Other Ambulatory Visit: Payer: Self-pay | Admitting: Cardiovascular Disease

## 2019-01-04 ENCOUNTER — Other Ambulatory Visit: Payer: Self-pay | Admitting: Internal Medicine

## 2019-01-04 ENCOUNTER — Other Ambulatory Visit: Payer: Self-pay | Admitting: Cardiovascular Disease

## 2019-01-04 NOTE — Telephone Encounter (Signed)
Rx request sent to pharmacy.  

## 2019-01-26 DIAGNOSIS — G4733 Obstructive sleep apnea (adult) (pediatric): Secondary | ICD-10-CM | POA: Diagnosis not present

## 2019-01-29 ENCOUNTER — Other Ambulatory Visit: Payer: Self-pay | Admitting: Cardiovascular Disease

## 2019-01-30 ENCOUNTER — Other Ambulatory Visit: Payer: Self-pay | Admitting: Cardiovascular Disease

## 2019-02-01 NOTE — Telephone Encounter (Signed)
Rx(s) sent to pharmacy electronically.  

## 2019-02-02 ENCOUNTER — Other Ambulatory Visit: Payer: Self-pay | Admitting: Cardiovascular Disease

## 2019-02-11 ENCOUNTER — Other Ambulatory Visit: Payer: Self-pay | Admitting: Cardiovascular Disease

## 2019-03-11 ENCOUNTER — Other Ambulatory Visit: Payer: Self-pay | Admitting: Cardiovascular Disease

## 2019-03-29 ENCOUNTER — Telehealth: Payer: Self-pay | Admitting: Cardiovascular Disease

## 2019-03-29 MED ORDER — CHLORTHALIDONE 25 MG PO TABS: 25.0000 mg | ORAL_TABLET | Freq: Every day | ORAL | 1 refills | Status: DC

## 2019-03-29 NOTE — Telephone Encounter (Signed)
Fax received from [facility] requesting refill of Chlorthalidone 25 MG.  Request sent for 90 day with 1 refill with request to schedule follow-up with Dr. Duke Salvia.

## 2019-04-08 ENCOUNTER — Encounter: Payer: Self-pay | Admitting: Neurology

## 2019-04-08 ENCOUNTER — Telehealth: Payer: Self-pay | Admitting: Neurology

## 2019-04-08 ENCOUNTER — Encounter: Payer: Self-pay | Admitting: Internal Medicine

## 2019-04-08 NOTE — Addendum Note (Signed)
Addended by: Judi Cong on: 04/08/2019 03:02 PM   Modules accepted: Orders

## 2019-04-08 NOTE — Telephone Encounter (Signed)
Called the patient and reviewed his chart with him. Patient states he had not received a email yet. Advised him to be on the look out for it and contact us if he has not gotten it.

## 2019-04-08 NOTE — Telephone Encounter (Signed)
Due to current COVID 19 pandemic, our office is severely reducing in office visits until further notice, in order to minimize the risk to our patients and healthcare providers.   Called patient to offer a sooner appointment via virtual visit with Dr. Vickey Huger. Patient was on the schedule to see Aundra Millet NP, and Aundra Millet is out of office. Patient agreed to do a virtual visit. I explained to patient how to do this and informed him that he will receive an e-mail with directions also. Patient verbalized understanding. Patient also understands that he will be called by nurse Baird Lyons prior to his appointment to update chart history, as well as a call from other office staff to update insurance info if needed.  Pt understands that although there may be some limitations with this type of visit, we will take all precautions to reduce any security or privacy concerns.  Pt understands that this will be treated like an in office visit and we will file with pt's insurance, and there may be a patient responsible charge related to this service.  Pt's email is [name]. Pt understands that the cisco webex software must be downloaded and operational on the device pt plans to use for the visit.

## 2019-04-12 ENCOUNTER — Encounter: Payer: Self-pay | Admitting: Adult Health

## 2019-04-14 ENCOUNTER — Other Ambulatory Visit: Payer: Self-pay

## 2019-04-14 ENCOUNTER — Encounter: Payer: Self-pay | Admitting: Neurology

## 2019-04-14 ENCOUNTER — Ambulatory Visit: Payer: BLUE CROSS/BLUE SHIELD | Admitting: Internal Medicine

## 2019-04-14 ENCOUNTER — Encounter: Payer: Self-pay | Admitting: Internal Medicine

## 2019-04-14 VITALS — BP 156/90 | HR 76 | Temp 98.3°F | Ht 68.0 in | Wt 243.0 lb

## 2019-04-14 DIAGNOSIS — R7309 Other abnormal glucose: Secondary | ICD-10-CM | POA: Diagnosis not present

## 2019-04-14 DIAGNOSIS — I10 Essential (primary) hypertension: Secondary | ICD-10-CM

## 2019-04-14 DIAGNOSIS — Z6836 Body mass index (BMI) 36.0-36.9, adult: Secondary | ICD-10-CM | POA: Diagnosis not present

## 2019-04-14 MED ORDER — METOPROLOL SUCCINATE ER 25 MG PO TB24: 25.0000 mg | ORAL_TABLET | Freq: Every day | ORAL | 1 refills | Status: DC

## 2019-04-14 MED ORDER — IRBESARTAN 300 MG PO TABS: 300.0000 mg | ORAL_TABLET | Freq: Every day | ORAL | 1 refills | Status: DC

## 2019-04-14 MED ORDER — AMLODIPINE BESYLATE 10 MG PO TABS: 10.0000 mg | ORAL_TABLET | Freq: Every day | ORAL | 1 refills | Status: DC

## 2019-04-14 MED ORDER — SPIRONOLACTONE 25 MG PO TABS: 25.0000 mg | ORAL_TABLET | Freq: Every day | ORAL | 1 refills | Status: DC

## 2019-04-14 MED ORDER — CHLORTHALIDONE 25 MG PO TABS: 25.0000 mg | ORAL_TABLET | Freq: Every day | ORAL | 1 refills | Status: DC

## 2019-04-14 NOTE — Patient Instructions (Signed)

## 2019-04-15 ENCOUNTER — Ambulatory Visit (INDEPENDENT_AMBULATORY_CARE_PROVIDER_SITE_OTHER): Payer: BLUE CROSS/BLUE SHIELD | Admitting: Neurology

## 2019-04-15 ENCOUNTER — Encounter: Payer: Self-pay | Admitting: Neurology

## 2019-04-15 DIAGNOSIS — G4733 Obstructive sleep apnea (adult) (pediatric): Secondary | ICD-10-CM

## 2019-04-15 DIAGNOSIS — G4726 Circadian rhythm sleep disorder, shift work type: Secondary | ICD-10-CM

## 2019-04-15 DIAGNOSIS — Z9989 Dependence on other enabling machines and devices: Secondary | ICD-10-CM

## 2019-04-15 LAB — CMP14+EGFR
ALT: 53 IU/L — ABNORMAL HIGH (ref 0–44)
AST: 28 IU/L (ref 0–40)
Albumin/Globulin Ratio: 1.8 (ref 1.2–2.2)
Albumin: 4.7 g/dL (ref 4.0–5.0)
Alkaline Phosphatase: 77 IU/L (ref 39–117)
BUN/Creatinine Ratio: 13 (ref 9–20)
BUN: 13 mg/dL (ref 6–24)
Bilirubin Total: 0.3 mg/dL (ref 0.0–1.2)
CO2: 27 mmol/L (ref 20–29)
Calcium: 9.7 mg/dL (ref 8.7–10.2)
Chloride: 101 mmol/L (ref 96–106)
Creatinine, Ser: 1.01 mg/dL (ref 0.76–1.27)
GFR calc Af Amer: 107 mL/min/{1.73_m2} (ref >59)
GFR calc non Af Amer: 93 mL/min/{1.73_m2} (ref >59)
Globulin, Total: 2.6 g/dL (ref 1.5–4.5)
Glucose: 96 mg/dL (ref 65–99)
Potassium: 4.2 mmol/L (ref 3.5–5.2)
Sodium: 145 mmol/L — ABNORMAL HIGH (ref 134–144)
Total Protein: 7.3 g/dL (ref 6.0–8.5)

## 2019-04-15 LAB — LIPID PANEL
Chol/HDL Ratio: 3.1 ratio (ref 0.0–5.0)
Cholesterol, Total: 150 mg/dL (ref 100–199)
HDL: 48 mg/dL (ref >39)
LDL Calculated: 86 mg/dL (ref 0–99)
Triglycerides: 80 mg/dL (ref 0–149)
VLDL Cholesterol Cal: 16 mg/dL (ref 5–40)

## 2019-04-15 LAB — HEMOGLOBIN A1C
Est. average glucose Bld gHb Est-mCnc: 131 mg/dL
Hgb A1c MFr Bld: 6.2 % — ABNORMAL HIGH (ref 4.8–5.6)

## 2019-04-15 NOTE — Patient Instructions (Signed)
I mailed a request to [facility] for a refurbished machine and refitting for a mask.

## 2019-04-15 NOTE — Progress Notes (Signed)
Virtual Visit via Video Note  I connected with [name] on 04/15/19 at  1:30 PM EDT by a video enabled telemedicine application and verified that I am speaking with the correct person using two identifiers.   I discussed the limitations of evaluation and management by telemedicine and the availability of in person appointments. The patient expressed understanding and agreed to proceed.     PATIENT: Austin [name] DOB: 10/13/1981  REASON FOR VISIT: follow up HISTORY FROM: patient  HISTORY OF PRESENT ILLNESS:  04/15/19 OSA patient Austin [name], meanwhile 38 years old, seen on 04-15-2019 with CPAP compliance problems.  CD- Currently using pillows, nasal, and losing these at night. He sleeps in ll positions.  He is still a shift worker, which reduces his sleep time to about 5 hours in daytime.  He has any way of restricted number of hours available to sleep and he states that he wakes up and has lost a mask in the process usually after about 3 hours of sleep and this is reflected in his download data.  The compliance report that we provided through [facility] shows on 12 April 2019 97% compliance by days but only 67% compliance by time.  Average use at time is 4 hours 46 minutes, his CPAP is not AutoSet set at 9 cm water pressure with a residual AHI of only 0.6, which speaks for a good resolution.  However he uses nasal pillows and these seem to allow some air leakage when they get displaced.   -   MM- Mr. [name] is a 38 year old male with a history of obstructive sleep apnea on CPAP.  His CPAP download indicates that he use his machine 27 out of 30 days for compliance of 90%.  He uses machine greater than 4 hours 8 out of 30 days for compliance of 27%.  On average he uses his machine 3 hours and 37 minutes.  His residual AHI is 1.5 on 9 cmH20 with EPR 3.  He does not have a significant leak.  He states that in the mornings he will wake up and the mask will be off his face laying on his  pillow.  He states that he does not recall taking the mask off during the night.  He states that he tends to do better using the mask when he sleeps at work as he has to sleep in a recliner and is not moving around.  He continues to take Provigil daily.  He returns today for evaluation.   HISTORY (Copied from Dr. Oliva Bustardohmeier's note): Austin [name] is a 38 y.o. male , seen here as a referral from NP Arnette FeltsJanece Moore  for a sleep consultation, Mr. [name] is seen here today on 04/10/2017 following a diagnostic polysomnography which was performed on 12/19/2016 which revealed mild apnea at 12.2 AHI but accentuated by REM sleep to 24.8,. Interestingly nonsupine sleep produced an higher apnea index than when he slept on his back. Nonsupine AHI was 18.3. The patient also had prolonged periods of oxygen desaturation with a total of 84 minutes under 89% saturation. He did not have any periodic limb movements. The study was followed by a CPAP titration during which a pressure of 9 cm water pressure and deviated the apnea. The patient used an air-fit nasal pillow in medium size. The compliance report shows that the patient has used the machine 29 out of 30 days but still struggles to use it 4 hours each night. Some nights I have only  3 or barely 4 hours of use. He reports that he sometimes loses contact with the mask. The average user time is 4 hours and 52 minutes, the machine is set at 9 cm with 3 cm EPR and a residual AHI of 1.3 is achieved. He does have very few air leaks.  [facility] gave him a different mask , Dreamwear mask and he likes it better. This explains the improving trend since March 24.  The patient feels not as drowsy during the day, his blood pressure is controlled on the current medications. The patient is not diabetic.    REVIEW OF SYSTEMS: Out of a complete 14 system review of symptoms, the patient complains only of the following symptoms, and all other reviewed systems are negative.  Chills, eye  discharge, daytime sleepiness, snoring, shift work . FSS 15  How likely are you to doze in the following situations: 0 = not likely, 1 = slight chance, 2 = moderate chance, 3 = high chance  Sitting and Reading? Watching Television? Sitting inactive in a public place (theater or meeting)? Lying down in the afternoon when circumstances permit? Sitting and talking to someone? Sitting quietly after lunch without alcohol? In a car, while stopped for a few minutes in traffic? As a passenger in a car for an hour without a break?  Total =8 from 11 pre CPAP. Still a shift Financial controller.      ALLERGIES: No Known Allergies  HOME MEDICATIONS: Outpatient Medications Prior to Visit  Medication Sig Dispense Refill  . amLODipine (NORVASC) 10 MG tablet Take 1 tablet (10 mg total) by mouth daily. 90 tablet 1  . chlorthalidone (HYGROTON) 25 MG tablet Take 1 tablet (25 mg total) by mouth daily. Please schedule appointment with Dr. Duke Salvia. 90 tablet 1  . fluticasone (FLONASE) 50 MCG/ACT nasal spray Place into both nostrils daily.    . irbesartan (AVAPRO) 300 MG tablet Take 1 tablet (300 mg total) by mouth daily. NEED OV. 90 tablet 1  . loratadine (CLARITIN) 10 MG tablet Take 10 mg by mouth daily.    Marland Kitchen MAGNESIUM CITRATE PO Take 500 mg by mouth daily.    . metoprolol succinate (TOPROL-XL) 25 MG 24 hr tablet Take 1 tablet (25 mg total) by mouth at bedtime. 90 tablet 1  . modafinil (PROVIGIL) 200 MG tablet TAKE 1 TABLET BY MOUTH DAILY 30 tablet 5  . spironolactone (ALDACTONE) 25 MG tablet Take 1 tablet (25 mg total) by mouth daily. Please schedule appointment with Dr. Duke Salvia for refills. 90 tablet 1   No facility-administered medications prior to visit.     PAST MEDICAL HISTORY: Past Medical History:  Diagnosis Date  . Essential hypertension 09/18/2017  . Hypertension     PAST SURGICAL HISTORY: Past Surgical History:  Procedure Laterality Date  . KNEE ARTHROSCOPY WITH ANTERIOR CRUCIATE LIGAMENT  (ACL) REPAIR      FAMILY HISTORY: Family History  Problem Relation Age of Onset  . Diabetes Mother   . Healthy Sister   . Healthy Brother   . Hypertension Maternal Grandmother   . Stroke Maternal Grandmother   . Multiple sclerosis Father     SOCIAL HISTORY: Social History   Socioeconomic History  . Marital status: Married    Spouse name: Not on file  . Number of children: Not on file  . Years of education: Not on file  . Highest education level: Not on file  Occupational History  . Not on file  Social Needs  . Financial resource strain:  Not on file  . Food insecurity:    Worry: Not on file    Inability: Not on file  . Transportation needs:    Medical: Not on file    Non-medical: Not on file  Tobacco Use  . Smoking status: Never Smoker  . Smokeless tobacco: Never Used  Substance and Sexual Activity  . Alcohol use: Yes    Alcohol/week: 2.0 standard drinks    Types: 2 Standard drinks or equivalent per week  . Drug use: No  . Sexual activity: Not on file  Lifestyle  . Physical activity:    Days per week: Not on file    Minutes per session: Not on file  . Stress: Not on file  Relationships  . Social connections:    Talks on phone: Not on file    Gets together: Not on file    Attends religious service: Not on file    Active member of club or organization: Not on file    Attends meetings of clubs or organizations: Not on file    Relationship status: Not on file  . Intimate partner violence:    Fear of current or ex partner: Not on file    Emotionally abused: Not on file    Physically abused: Not on file    Forced sexual activity: Not on file  Other Topics Concern  . Not on file  Social History Narrative  . Not on file      Observations/Objective:  There were no vitals filed for this visit. There is no height or weight on file to calculate BMI.  Patient reported no changes in weight.   Generalized: Well developed, in no acute distress , cooperative.  Well groomed.   Neck circumference 18-1/2 inches Mallampati 4+  Neurological examination  Mentation: Alert oriented to time, place, history taking. Follows all commands speech and language fluent Cranial nerve II-XII: Pupils were equal , Facial  strength symmetrically l. Uvula and tongue move in midline. Head turning and shoulder shrug  were normal and symmetric.    DIAGNOSTIC DATA (LABS, IMAGING, TESTING) - I reviewed patient records, labs, notes, testing and imaging myself where available.   Lab Results  Component Value Date   TSH 1.560 10/23/2017      ASSESSMENT AND PLAN 38 y.o. year old male  has a past medical history of Essential hypertension (09/18/2017) and Hypertension. here with:  1.  Obstructive sleep apnea on CPAP, borderline compliance., asked for a second CPAP machine , which would not be covered by insurance but could be purchased as a Equities trader.   2.  Daytime sleepiness improved under CPAP use. AHI is  Very well controlled. 3.  Circadian rhythm sleep disorder shift work- cannot allocate more daytime hours to sleep.   The download showed that the patient is using the machine nightly but not greater than 4 hours each night.  The patient does not have a significant leak therefore changing to a different mask most likely will not offer him much benefit.  I advised the patient that he could try to set an alarm after 4 hours to alert him to put his mask back on.  Patient voiced understanding.  Advised that if his symptoms worsen or he develop new symptoms he should let us know.  He will follow-up in 6 months or sooner if needed.  I spent 15 minutes with the patient. 50% of this time was spent reviewing his CPAP download    I discussed the assessment  and treatment plan with the patient. The patient was provided an opportunity to ask questions and all were answered. The patient agreed with the plan and demonstrated an understanding of the instructions.   The patient  was advised to call back or seek an in-person evaluation if the symptoms worsen or if the condition fails to improve as anticipated.  I provided 15.2 minutes of non-face-to-face time during this encounter.  04/15/2019, 1:44 PM [facility] [address], [address] [place], [place] [address] [phone]   Melvyn Novas, MD

## 2019-04-19 ENCOUNTER — Ambulatory Visit: Payer: BLUE CROSS/BLUE SHIELD | Admitting: Adult Health

## 2019-04-25 ENCOUNTER — Encounter: Payer: Self-pay | Admitting: Internal Medicine

## 2019-04-25 NOTE — Progress Notes (Signed)
Subjective:     Patient ID: Austin [name] , male    DOB: 08/10/1981 , 38 y.o.   MRN: [id number]   Chief Complaint  Patient presents with  . Hypertension    HPI  Hypertension  This is a chronic problem. The current episode started more than 1 year ago. The problem has been gradually improving since onset. The problem is uncontrolled. Risk factors for coronary artery disease include male gender and obesity.     Past Medical History:  Diagnosis Date  . Essential hypertension 09/18/2017  . Hypertension      Family History  Problem Relation Age of Onset  . Diabetes Mother   . Healthy Sister   . Healthy Brother   . Hypertension Maternal Grandmother   . Stroke Maternal Grandmother   . Multiple sclerosis Father      Current Outpatient Medications:  .  fluticasone (FLONASE) 50 MCG/ACT nasal spray, Place into both nostrils daily., Disp: , Rfl:  .  loratadine (CLARITIN) 10 MG tablet, Take 10 mg by mouth daily., Disp: , Rfl:  .  MAGNESIUM CITRATE PO, Take 500 mg by mouth daily., Disp: , Rfl:  .  modafinil (PROVIGIL) 200 MG tablet, TAKE 1 TABLET BY MOUTH DAILY, Disp: 30 tablet, Rfl: 5 .  amLODipine (NORVASC) 10 MG tablet, Take 1 tablet (10 mg total) by mouth daily., Disp: 90 tablet, Rfl: 1 .  chlorthalidone (HYGROTON) 25 MG tablet, Take 1 tablet (25 mg total) by mouth daily. Please schedule appointment with Dr. Oval Linsey., Disp: 90 tablet, Rfl: 1 .  irbesartan (AVAPRO) 300 MG tablet, Take 1 tablet (300 mg total) by mouth daily. NEED OV., Disp: 90 tablet, Rfl: 1 .  metoprolol succinate (TOPROL-XL) 25 MG 24 hr tablet, Take 1 tablet (25 mg total) by mouth at bedtime., Disp: 90 tablet, Rfl: 1 .  spironolactone (ALDACTONE) 25 MG tablet, Take 1 tablet (25 mg total) by mouth daily. Please schedule appointment with Dr. Oval Linsey for refills., Disp: 90 tablet, Rfl: 1   No Known Allergies   Review of Systems  Constitutional: Negative.   Respiratory: Negative.   Cardiovascular: Negative.    Gastrointestinal: Negative.   Neurological: Negative.   Psychiatric/Behavioral: Negative.      Today's Vitals   04/14/19 1024  BP: (!) 156/90  Pulse: 76  Temp: 98.3 F (36.8 C)  TempSrc: Oral  Weight: 243 lb (110.2 kg)  Height: '5\' 8"'  (1.727 m)   Body mass index is 36.95 kg/m.   Objective:  Physical Exam Vitals signs and nursing note reviewed.  Constitutional:      Appearance: Normal appearance.  Cardiovascular:     Rate and Rhythm: Normal rate and regular rhythm.     Heart sounds: Normal heart sounds.  Pulmonary:     Effort: Pulmonary effort is normal.     Breath sounds: Normal breath sounds.  Skin:    General: Skin is warm.  Neurological:     General: No focal deficit present.     Mental Status: He is alert.  Psychiatric:        Mood and Affect: Mood normal.         Assessment And Plan:     1. Essential hypertension  Uncontrolled. He admits he has yet to take meds today. Importance of medication compliance was discussed with the patient. He is encouraged to avoid adding salt to his foods. He will rto in 3 months for re-evaluation.   - CMP14+EGFR - Lipid panel  2. Other abnormal glucose  HIS A1C HAS BEEN ELEVATED IN THE PAST. I WILL CHECK AN A1C, BMET TODAY.  HE WAS ENCOURAGED TO AVOID SUGARY BEVERAGES AND PROCESSED FOODS INCLUDNG BREADS, RICE AND PASTA.  - Hemoglobin A1c  3. Class 2 severe obesity due to excess calories with serious comorbidity and body mass index (BMI) of 36.0 to 36.9 in adult [facility])  Importance of achieving optimal weight to decrease risk of cardiovascular disease and cancers was discussed with the patient in full detail. zhe is encouraged to start slowly - start with 10 minutes twice daily at least three to four days per week and to gradually build to 30 minutes five days weekly. He was given tips to incorporate more activity into her daily routine - take stairs when possible, park farther away from his job, grocery stores, etc.        [name], MD    THE PATIENT IS ENCOURAGED TO PRACTICE SOCIAL DISTANCING DUE TO THE COVID-19 PANDEMIC.

## 2019-05-04 ENCOUNTER — Ambulatory Visit: Payer: BLUE CROSS/BLUE SHIELD | Admitting: Adult Health

## 2019-05-05 DIAGNOSIS — G4733 Obstructive sleep apnea (adult) (pediatric): Secondary | ICD-10-CM | POA: Diagnosis not present

## 2019-06-10 DIAGNOSIS — H5213 Myopia, bilateral: Secondary | ICD-10-CM | POA: Diagnosis not present

## 2019-07-07 ENCOUNTER — Other Ambulatory Visit: Payer: Self-pay | Admitting: *Deleted

## 2019-07-07 DIAGNOSIS — R6889 Other general symptoms and signs: Secondary | ICD-10-CM | POA: Diagnosis not present

## 2019-07-07 DIAGNOSIS — Z20822 Contact with and (suspected) exposure to covid-19: Secondary | ICD-10-CM

## 2019-07-12 LAB — NOVEL CORONAVIRUS, NAA: SARS-CoV-2, NAA: NOT DETECTED

## 2019-08-02 DIAGNOSIS — L309 Dermatitis, unspecified: Secondary | ICD-10-CM | POA: Diagnosis not present

## 2019-08-02 DIAGNOSIS — L2084 Intrinsic (allergic) eczema: Secondary | ICD-10-CM | POA: Diagnosis not present

## 2019-08-19 ENCOUNTER — Other Ambulatory Visit: Payer: Self-pay

## 2019-08-19 ENCOUNTER — Encounter: Payer: Self-pay | Admitting: Internal Medicine

## 2019-09-23 ENCOUNTER — Ambulatory Visit: Payer: BC Managed Care – PPO | Admitting: Internal Medicine

## 2019-09-23 ENCOUNTER — Other Ambulatory Visit: Payer: Self-pay

## 2019-09-23 ENCOUNTER — Encounter: Payer: Self-pay | Admitting: Internal Medicine

## 2019-09-23 VITALS — BP 132/84 | HR 89 | Temp 98.3°F | Ht 66.2 in | Wt 247.0 lb

## 2019-09-23 DIAGNOSIS — Z6839 Body mass index (BMI) 39.0-39.9, adult: Secondary | ICD-10-CM

## 2019-09-23 DIAGNOSIS — Z23 Encounter for immunization: Secondary | ICD-10-CM

## 2019-09-23 DIAGNOSIS — I10 Essential (primary) hypertension: Secondary | ICD-10-CM

## 2019-09-23 DIAGNOSIS — Z0000 Encounter for general adult medical examination without abnormal findings: Secondary | ICD-10-CM | POA: Diagnosis not present

## 2019-09-23 LAB — POCT URINALYSIS DIPSTICK
Bilirubin, UA: NEGATIVE
Glucose, UA: NEGATIVE
Ketones, UA: NEGATIVE
Leukocytes, UA: NEGATIVE
Nitrite, UA: NEGATIVE
Protein, UA: NEGATIVE
Spec Grav, UA: 1.025 (ref 1.010–1.025)
Urobilinogen, UA: 0.2 E.U./dL
pH, UA: 6 (ref 5.0–8.0)

## 2019-09-23 LAB — POCT UA - MICROALBUMIN
Albumin/Creatinine Ratio, Urine, POC: 30
Creatinine, POC: 300 mg/dL
Microalbumin Ur, POC: 10 mg/L

## 2019-09-23 NOTE — Patient Instructions (Signed)

## 2019-09-24 LAB — LIPID PANEL
Chol/HDL Ratio: 3.4 ratio (ref 0.0–5.0)
Cholesterol, Total: 146 mg/dL (ref 100–199)
HDL: 43 mg/dL (ref >39)
LDL Chol Calc (NIH): 68 mg/dL (ref 0–99)
Triglycerides: 211 mg/dL — ABNORMAL HIGH (ref 0–149)
VLDL Cholesterol Cal: 35 mg/dL (ref 5–40)

## 2019-09-24 LAB — CMP14+EGFR
ALT: 57 IU/L — ABNORMAL HIGH (ref 0–44)
AST: 31 IU/L (ref 0–40)
Albumin/Globulin Ratio: 1.6 (ref 1.2–2.2)
Albumin: 4.5 g/dL (ref 4.0–5.0)
Alkaline Phosphatase: 85 IU/L (ref 39–117)
BUN/Creatinine Ratio: 19 (ref 9–20)
BUN: 23 mg/dL (ref 6–24)
Bilirubin Total: <0.2 mg/dL (ref 0.0–1.2)
CO2: 23 mmol/L (ref 20–29)
Calcium: 9.8 mg/dL (ref 8.7–10.2)
Chloride: 101 mmol/L (ref 96–106)
Creatinine, Ser: 1.2 mg/dL (ref 0.76–1.27)
GFR calc Af Amer: 87 mL/min/{1.73_m2} (ref >59)
GFR calc non Af Amer: 75 mL/min/{1.73_m2} (ref >59)
Globulin, Total: 2.8 g/dL (ref 1.5–4.5)
Glucose: 98 mg/dL (ref 65–99)
Potassium: 4.1 mmol/L (ref 3.5–5.2)
Sodium: 141 mmol/L (ref 134–144)
Total Protein: 7.3 g/dL (ref 6.0–8.5)

## 2019-09-24 LAB — CBC
Hematocrit: 45.5 % (ref 37.5–51.0)
Hemoglobin: 14.6 g/dL (ref 13.0–17.7)
MCH: 26.4 pg — ABNORMAL LOW (ref 26.6–33.0)
MCHC: 32.1 g/dL (ref 31.5–35.7)
MCV: 82 fL (ref 79–97)
Platelets: 185 10*3/uL (ref 150–450)
RBC: 5.54 x10E6/uL (ref 4.14–5.80)
RDW: 14.5 % (ref 11.6–15.4)
WBC: 7.9 10*3/uL (ref 3.4–10.8)

## 2019-09-24 LAB — HEMOGLOBIN A1C
Est. average glucose Bld gHb Est-mCnc: 140 mg/dL
Hgb A1c MFr Bld: 6.5 % — ABNORMAL HIGH (ref 4.8–5.6)

## 2019-09-24 LAB — PSA: Prostate Specific Ag, Serum: 0.5 ng/mL (ref 0.0–4.0)

## 2019-09-25 NOTE — Progress Notes (Signed)
Subjective:     Patient ID: Austin [name] , male    DOB: 26-Dec-1981 , 38 y.o.   MRN: [id number]   Chief Complaint  Patient presents with  . Annual Exam  . Hypertension    HPI  He is here today for a full physical exam. He has no specific concerns or complaints at this time.   Hypertension This is a chronic problem. The current episode started more than 1 year ago. The problem has been gradually improving since onset. The problem is controlled. Pertinent negatives include no blurred vision, chest pain, palpitations or shortness of breath. Risk factors for coronary artery disease include sedentary lifestyle, male gender and obesity. Past treatments include beta blockers, calcium channel blockers and diuretics. The current treatment provides moderate improvement. Compliance problems include exercise.      Past Medical History:  Diagnosis Date  . Essential hypertension 09/18/2017  . Hypertension      Family History  Problem Relation Age of Onset  . Diabetes Mother   . Healthy Sister   . Healthy Brother   . Hypertension Maternal Grandmother   . Stroke Maternal Grandmother   . Multiple sclerosis Father      Current Outpatient Medications:  .  amLODipine (NORVASC) 10 MG tablet, Take 1 tablet (10 mg total) by mouth daily., Disp: 90 tablet, Rfl: 1 .  chlorthalidone (HYGROTON) 25 MG tablet, Take 1 tablet (25 mg total) by mouth daily. Please schedule appointment with Dr. Oval Linsey., Disp: 90 tablet, Rfl: 1 .  fluticasone (FLONASE) 50 MCG/ACT nasal spray, Place into both nostrils daily., Disp: , Rfl:  .  irbesartan (AVAPRO) 300 MG tablet, Take 1 tablet (300 mg total) by mouth daily. NEED OV., Disp: 90 tablet, Rfl: 1 .  loratadine (CLARITIN) 10 MG tablet, Take 10 mg by mouth daily., Disp: , Rfl:  .  MAGNESIUM CITRATE PO, Take 500 mg by mouth daily., Disp: , Rfl:  .  metoprolol succinate (TOPROL-XL) 25 MG 24 hr tablet, Take 1 tablet (25 mg total) by mouth at bedtime., Disp: 90 tablet,  Rfl: 1 .  modafinil (PROVIGIL) 200 MG tablet, TAKE 1 TABLET BY MOUTH DAILY, Disp: 30 tablet, Rfl: 5 .  spironolactone (ALDACTONE) 25 MG tablet, Take 1 tablet (25 mg total) by mouth daily. Please schedule appointment with Dr. Oval Linsey for refills. (Patient not taking: Reported on 09/23/2019), Disp: 90 tablet, Rfl: 1   No Known Allergies   Review of Systems  Constitutional: Negative.   HENT: Negative.   Eyes: Negative.  Negative for blurred vision.  Respiratory: Negative.  Negative for shortness of breath.   Cardiovascular: Negative.  Negative for chest pain and palpitations.  Endocrine: Negative.   Genitourinary: Negative.   Musculoskeletal: Negative.   Skin: Negative.   Allergic/Immunologic: Negative.   Neurological: Negative.   Hematological: Negative.   Psychiatric/Behavioral: Negative.      Today's Vitals   09/23/19 1548  BP: 132/84  Pulse: 89  Temp: 98.3 F (36.8 C)  TempSrc: Oral  SpO2: 97%  Weight: 247 lb (112 kg)  Height: 5' 6.2" (1.681 m)   Body mass index is 39.63 kg/m.   Objective:  Physical Exam Vitals signs and nursing note reviewed.  Constitutional:      Appearance: Normal appearance. He is obese.  HENT:     Head: Normocephalic and atraumatic.     Right Ear: Tympanic membrane, ear canal and external ear normal.     Left Ear: Tympanic membrane, ear canal and external ear normal.  Nose: Nose normal.     Mouth/Throat:     Mouth: Mucous membranes are moist.     Pharynx: Oropharynx is clear.  Eyes:     Extraocular Movements: Extraocular movements intact.     Conjunctiva/sclera: Conjunctivae normal.     Pupils: Pupils are equal, round, and reactive to light.  Neck:     Musculoskeletal: Normal range of motion and neck supple.  Cardiovascular:     Rate and Rhythm: Normal rate and regular rhythm.     Pulses: Normal pulses.     Heart sounds: Normal heart sounds.  Pulmonary:     Effort: Pulmonary effort is normal.     Breath sounds: Normal breath  sounds.  Chest:     Breasts:        Right: Normal. No swelling, bleeding, inverted nipple, mass or nipple discharge.        Left: Normal. No swelling, bleeding, inverted nipple, mass or nipple discharge.  Abdominal:     General: Abdomen is flat. Bowel sounds are normal.     Palpations: Abdomen is soft.  Genitourinary:    Comments: deferred Musculoskeletal: Normal range of motion.  Skin:    General: Skin is warm.  Neurological:     General: No focal deficit present.     Mental Status: He is alert.  Psychiatric:        Mood and Affect: Mood normal.        Behavior: Behavior normal.         Assessment And Plan:     1. Encounter for annual physical exam  A full exam was performed.  I will check baseline PSA today.  PATIENT HAS BEEN ADVISED TO GET 30-45 MINUTES REGULAR EXERCISE NO LESS THAN FOUR TO FIVE DAYS PER WEEK - BOTH WEIGHTBEARING EXERCISES AND AEROBIC ARE RECOMMENDED.  HE WAS ADVISED TO FOLLOW A HEALTHY DIET WITH AT LEAST SIX FRUITS/VEGGIES PER DAY, DECREASE INTAKE OF RED MEAT, AND TO INCREASE FISH INTAKE TO TWO DAYS PER WEEK.  MEATS/FISH SHOULD NOT BE FRIED, BAKED OR BROILED IS PREFERABLE.  I SUGGEST WEARING SPF 50 SUNSCREEN ON EXPOSED PARTS AND ESPECIALLY WHEN IN THE DIRECT SUNLIGHT FOR AN EXTENDED PERIOD OF TIME.  PLEASE AVOID FAST FOOD RESTAURANTS AND INCREASE YOUR WATER INTAKE.  - Flu Vaccine QUAD 6+ mos PF IM (Fluarix Quad PF) - CMP14+EGFR - CBC - Lipid panel - Hemoglobin A1c - PSA   2. Essential hypertension  Chronic, fair control. He will continue with current meds. He is encouraged to avoid adding salt to her foods.  EKG performed, no new changes noted. He will rto in six months for re-evaluation. He is encouraged to incorporate more exercise into his daily routine.   - POCT Urinalysis Dipstick (81002) - POCT UA - Microalbumin - EKG 12-Lead  3. Need for influenza vaccination  He was given flu vaccine.   - POCT Urinalysis Dipstick (81002) - POCT UA -  Microalbumin - EKG 12-Lead  4. Class 2 severe obesity due to excess calories with serious comorbidity and body mass index (BMI) of 39.0 to 39.9 in adult [facility])  Importance of achieving optimal weight to decrease risk of cardiovascular disease and cancers was discussed with the patient in full detail. Importance of regular exercise was discussed with the patient. He is encouraged to start slowly - start with 10 minutes twice daily at least three to four days per week and to gradually build to 30 minutes five days weekly. He was given tips to incorporate  more activity into his daily routine - take stairs when possible, park farther away from his job, grocery stores, etc.     [name], MD    THE PATIENT IS ENCOURAGED TO PRACTICE SOCIAL DISTANCING DUE TO THE COVID-19 PANDEMIC.

## 2019-09-30 ENCOUNTER — Other Ambulatory Visit: Payer: Self-pay | Admitting: Adult Health

## 2019-09-30 DIAGNOSIS — G4719 Other hypersomnia: Secondary | ICD-10-CM

## 2019-09-30 DIAGNOSIS — G4726 Circadian rhythm sleep disorder, shift work type: Secondary | ICD-10-CM

## 2019-09-30 NOTE — Telephone Encounter (Signed)
Drug registry checked modafinil 200mg  po daily, last fill 11-04-2018.  Made 6 month visit 10-15-19 with MM/NP mychart VV. Works nights.  He uses PRN.

## 2019-10-07 ENCOUNTER — Other Ambulatory Visit: Payer: Self-pay | Admitting: Cardiovascular Disease

## 2019-10-07 NOTE — Telephone Encounter (Signed)
Pt overdue for future follow up. Please contact pt for future appointment. 

## 2019-10-07 NOTE — Telephone Encounter (Signed)
Rx request sent to pharmacy.  

## 2019-10-11 ENCOUNTER — Telehealth: Payer: Self-pay | Admitting: Neurology

## 2019-10-11 NOTE — Telephone Encounter (Signed)
PA completed and submitted through cover my meds/BCBS. KEY: [id number]  Approved 10/11/2019 through 10/09/2022.

## 2019-10-20 ENCOUNTER — Telehealth: Payer: Self-pay

## 2019-10-20 ENCOUNTER — Telehealth: Payer: Self-pay | Admitting: Adult Health

## 2019-10-20 ENCOUNTER — Encounter: Payer: Self-pay | Admitting: Adult Health

## 2019-10-20 NOTE — Telephone Encounter (Signed)
Patient was a no show for their virtual visit today.  

## 2019-11-15 DIAGNOSIS — L2084 Intrinsic (allergic) eczema: Secondary | ICD-10-CM | POA: Diagnosis not present

## 2019-11-15 DIAGNOSIS — L309 Dermatitis, unspecified: Secondary | ICD-10-CM | POA: Diagnosis not present

## 2019-12-29 ENCOUNTER — Other Ambulatory Visit: Payer: Self-pay

## 2019-12-29 ENCOUNTER — Encounter: Payer: Self-pay | Admitting: Cardiovascular Disease

## 2019-12-29 ENCOUNTER — Ambulatory Visit: Payer: BC Managed Care – PPO | Admitting: Cardiovascular Disease

## 2019-12-29 VITALS — BP 124/85 | HR 77 | Ht 68.0 in | Wt 252.8 lb

## 2019-12-29 DIAGNOSIS — I517 Cardiomegaly: Secondary | ICD-10-CM | POA: Diagnosis not present

## 2019-12-29 DIAGNOSIS — I10 Essential (primary) hypertension: Secondary | ICD-10-CM | POA: Diagnosis not present

## 2019-12-29 NOTE — Patient Instructions (Addendum)
Medication Instructions:  NO CHANGES *If you need a refill on your cardiac medications before your next appointment, please call your pharmacy*  Lab Work: NONE NEEDED  Testing/Procedures: NONE NEEDED  Follow-Up: At [facility], you and your health needs are our priority.  As part of our continuing mission to provide you with exceptional heart care, we have created designated Provider Care Teams.  These Care Teams include your primary Cardiologist (physician) and Advanced Practice Providers (APPs -  Physician Assistants and Nurse Practitioners) who all work together to provide you with the care you need, when you need it.    Other Instructions FOLLOW UP AS NEEDED WITH DR. [place]

## 2019-12-29 NOTE — Progress Notes (Signed)
Cardiology Office Note   Date:  12/29/2019   ID:  Austin [name], Austin [name] October 21, 1981, MRN [id number]  PCP:  Glendale Chard, MD  Cardiologist:   Skeet Latch, MD   No chief complaint on file.   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  He was initially seen 08/2017 for evaluation of hypertension.  At that time his BP was poorly controlled.  He was switched from Edarbyclor to Irbesartan and chlorthalidone.  Metoprolol was switched to carvedilol.  He continued on amlodipine and hydralazine.   He was referred for renal artery Dopplers 09/2017 that were normal.  He had an echo at that time that revealed LVEF 55 to 60% with mild LVH and it was otherwise unremarkable.  Since that time he has been well.  His blood pressure has been around 130/80.  He has not been exercising much.  He reports that this is because he is tired when he gets home from work.  He works 12-hour shifts 4 days/week.  He has no exertional chest pain or shortness of breath.  He denies lower extremity edema, orthopnea, or PND.  He reports that his diet has been good.  He limits fried and fatty foods.  He also eliminated pork from his diet.  He mostly drinks water.  Overall he has been well.   Past Medical History:  Diagnosis Date  . Essential hypertension 09/18/2017  . Hypertension     Past Surgical History:  Procedure Laterality Date  . KNEE ARTHROSCOPY WITH ANTERIOR CRUCIATE LIGAMENT (ACL) REPAIR       Current Outpatient Medications  Medication Sig Dispense Refill  . amLODipine (NORVASC) 10 MG tablet Take 1 tablet (10 mg total) by mouth daily. 90 tablet 1  . chlorthalidone (HYGROTON) 25 MG tablet Take 1 tablet (25 mg total) by mouth daily. Please schedule appointment for refills. 90 tablet 1  . fluticasone (FLONASE) 50 MCG/ACT nasal spray Place into both nostrils daily.    . irbesartan (AVAPRO) 300 MG tablet  Take 1 tablet (300 mg total) by mouth daily. NEED OV. 90 tablet 1  . loratadine (CLARITIN) 10 MG tablet Take 10 mg by mouth daily.    Marland Kitchen MAGNESIUM CITRATE PO Take 500 mg by mouth daily.    . metoprolol succinate (TOPROL-XL) 25 MG 24 hr tablet Take 1 tablet (25 mg total) by mouth at bedtime. 90 tablet 1  . modafinil (PROVIGIL) 200 MG tablet TAKE 1 TABLET BY MOUTH DAILY 30 tablet 5  . spironolactone (ALDACTONE) 25 MG tablet Take 1 tablet (25 mg total) by mouth daily. Please schedule appointment with Dr. Oval Linsey for refills. 90 tablet 1   No current facility-administered medications for this visit.    Allergies:   Patient has no known allergies.    Social History:  The patient  reports that he has never smoked. He has never used smokeless tobacco. He reports current alcohol use of about 2.0 standard drinks of alcohol per week. He reports that he does not use drugs.   Family History:  The patient's family history includes Diabetes in his mother; Healthy in his brother and sister; Hypertension in his maternal grandmother; Multiple sclerosis in his father; Stroke in his maternal grandmother.    ROS:  Please see the history of present illness.   Otherwise, review of systems are positive for none.   All other systems are reviewed and  negative.    PHYSICAL EXAM: VS:  BP 124/85   Pulse 77   Ht 5\' 8"  (1.727 m)   Wt 252 lb 12.8 oz (114.7 kg)   SpO2 97%   BMI 38.44 kg/m  , BMI Body mass index is 38.44 kg/m. GENERAL:  Well appearing HEENT: Pupils equal round and reactive, fundi not visualized, oral mucosa unremarkable NECK:  No jugular venous distention, waveform within normal limits, carotid upstroke brisk and symmetric, no bruits LUNGS:  Clear to auscultation bilaterally HEART:  RRR.  PMI not displaced or sustained,S1 and S2 within normal limits, no S3, no S4, no clicks, no rubs, no murmurs ABD:  Flat, positive bowel sounds normal in frequency in pitch, no bruits, no rebound, no guarding, no  midline pulsatile mass, no hepatomegaly, no splenomegaly EXT:  2 plus pulses throughout, no edema, no cyanosis no clubbing SKIN:  No rashes no nodules NEURO:  Cranial nerves II through XII grossly intact, motor grossly intact throughout PSYCH:  Cognitively intact, oriented to person place and time   EKG:  EKG is ordered today. The ekg ordered today demonstrates sinus rhythm.  77 bpm.  Non-specific T wave abnormalities.  Recent Labs: 09/23/2019: ALT 57; BUN 23; Creatinine, Ser 1.20; Hemoglobin 14.6; Platelets 185; Potassium 4.1; Sodium 141   07/21/17: Sodium 142, potassium 3.4, BUN 16, creatinine 1.18 AST 25, ALT 41 Hemoglobin A1c 6.0% Magnesium 2.1  Lipid Panel    Component Value Date/Time   CHOL 146 09/23/2019 1632   TRIG 211 (H) 09/23/2019 1632   HDL 43 09/23/2019 1632   CHOLHDL 3.4 09/23/2019 1632   LDLCALC 68 09/23/2019 1632      Wt Readings from Last 3 Encounters:  12/29/19 252 lb 12.8 oz (114.7 kg)  09/23/19 247 lb (112 kg)  04/14/19 243 lb (110.2 kg)      ASSESSMENT AND PLAN:  # Hypertension: Blood pressure has been controlled mostly.  Would likely be consistently below 130/80 if he were to increase his exercise.  Recommended that he increase to 150 minutes of exercise weekly.  He was congratulated on his dietary changes.  Continue amlodipine, chlorthalidone, irbesartan, metoprolol, and spironolactone.  # LVH with repolarization abnormality: Echo.  No evidence of hear failure on exam.   Current medicines are reviewed at length with the patient today.  The patient does not have concerns regarding medicines.  The following changes have been made:  no change  Labs/ tests ordered today include:   No orders of the defined types were placed in this encounter.    Disposition:   FU with Matha Masse C. 04/16/19, MD, [facility] as needed   This note was written with the assistance of speech recognition software.  Please excuse any transcriptional errors.  Signed, Arihana Ambrocio  C. [facility], MD, [facility]  12/29/2019 10:26 AM    [facility]

## 2020-01-06 ENCOUNTER — Encounter: Payer: Self-pay | Admitting: Cardiovascular Disease

## 2020-01-16 ENCOUNTER — Ambulatory Visit: Payer: BC Managed Care – PPO | Attending: Internal Medicine

## 2020-01-16 DIAGNOSIS — Z23 Encounter for immunization: Secondary | ICD-10-CM | POA: Insufficient documentation

## 2020-01-16 NOTE — Progress Notes (Signed)
   Covid-19 Vaccination Clinic  Name:  Austin [name]    MRN: [id number] DOB: [birth year]  01/16/2020  [name] was observed post Covid-19 immunization for 15 minutes without incidence. He was provided with Vaccine Information Sheet and instruction to access the V-Safe system.   [name] was instructed to call 911 with any severe reactions post vaccine: Marland Kitchen Difficulty breathing  . Swelling of your face and throat  . A fast heartbeat  . A bad rash all over your body  . Dizziness and weakness

## 2020-01-17 DIAGNOSIS — U071 COVID-19: Secondary | ICD-10-CM | POA: Diagnosis not present

## 2020-01-18 DIAGNOSIS — R05 Cough: Secondary | ICD-10-CM | POA: Diagnosis not present

## 2020-01-18 DIAGNOSIS — R509 Fever, unspecified: Secondary | ICD-10-CM | POA: Diagnosis not present

## 2020-01-18 DIAGNOSIS — Z7189 Other specified counseling: Secondary | ICD-10-CM | POA: Diagnosis not present

## 2020-01-19 ENCOUNTER — Other Ambulatory Visit: Payer: BC Managed Care – PPO

## 2020-01-20 ENCOUNTER — Inpatient Hospital Stay: Admission: RE | Admit: 2020-01-20 | Payer: BC Managed Care – PPO | Source: Ambulatory Visit

## 2020-01-20 ENCOUNTER — Telehealth: Payer: BC Managed Care – PPO | Admitting: Physician Assistant

## 2020-01-20 DIAGNOSIS — Z20822 Contact with and (suspected) exposure to covid-19: Secondary | ICD-10-CM

## 2020-01-20 MED ORDER — ALBUTEROL SULFATE HFA 108 (90 BASE) MCG/ACT IN AERS: 2.0000 | INHALATION_SPRAY | Freq: Four times a day (QID) | RESPIRATORY_TRACT | 0 refills | Status: DC | PRN

## 2020-01-20 MED ORDER — BENZONATATE 100 MG PO CAPS: 100.0000 mg | ORAL_CAPSULE | Freq: Three times a day (TID) | ORAL | 0 refills | Status: DC | PRN

## 2020-01-20 NOTE — Progress Notes (Signed)
E-Visit for Corona Virus Screening  Your current symptoms could be consistent with the coronavirus.  Many health care providers can now test patients at their office but not all are.  [place] has multiple testing sites. For information on our COVID testing locations and hours go to [email or web address]  We are enrolling you in our MyChart Home Monitoring for COVID19 . Daily you will receive a questionnaire within the MyChart website. Our COVID 19 response team will be monitoring your responses daily.  Testing Information: The COVID-19 Community Testing sites will begin testing BY APPOINTMENT ONLY.  You can schedule online at [email or web address]  If you do not have access to a smart phone or computer you may call [phone] for an appointment.   Additional testing sites in the Community:  . For [facility] Testing sites in [place]  https://www.cvs.com/minuteclinic/covid-19-testing  . For Pop-up testing sites in [place]  https://covid19.ncdhhs.gov/about-covid-19/testing/find-my-testing-place/pop-testing-sites  . For Testing sites with regular hours https://onsms.org/[place]/  . For [place] https://tapmedicine.com/covid-19-community-outreach-testing/  . For [facility] https://[place].gov[place]/human-services/health-department/coronavirus-covid-19-info/covid-19-testing  . For [place] testing in [place] and [place] https://www.[place].gov/[place]/human-services/health-department/coronavirus-covid-19-info/covid-19-testing  . For [facility] testing in [place]   https://lhi.care/covidtesting  For  more information about community testing call [phone]   Please quarantine yourself while awaiting your test results. Please stay home for a minimum of 10 days from the first day of illness with improving symptoms and you have had 24 hours of no fever (without the use of Tylenol (Acetaminophen)  Motrin (Ibuprofen) or any fever reducing medication).  Also - Do not get tested prior to returning to work because once you have had a positive test the test can stay positive for more then a month in some cases.   You should wear a mask or cloth face covering over your nose and mouth if you must be around other people or animals, including pets (even at home). Try to stay at least 6 feet away from other people. This will protect the people around you.  Please continue good preventive care measures, including:  frequent hand-washing, avoid touching your face, cover coughs/sneezes, stay out of crowds and keep a 6 foot distance from others.  COVID-19 is a respiratory illness with symptoms that are similar to the flu. Symptoms are typically mild to moderate, but there have been cases of severe illness and death due to the virus.   The following symptoms may appear 2-14 days after exposure: . Fever . Cough . Shortness of breath or difficulty breathing . Chills . Repeated shaking with chills . Muscle pain . Headache . Sore throat . New loss of taste or smell . Fatigue . Congestion or runny nose . Nausea or vomiting . Diarrhea  Go to the nearest hospital ED for assessment if fever/cough/breathlessness are severe or illness seems like a threat to life.  It is vitally important that if you feel that you have an infection such as this virus or any other virus that you stay home and away from places where you may spread it to others.  You should avoid contact with people age 65 and older.   You can use medication such as A prescription cough medication called Tessalon Perles 100 mg. You may take 1-2 capsules every 8 hours as needed for cough and A prescription inhaler called Albuterol MDI 90 mcg /actuation 2 puffs every 4 hours as needed for shortness of breath, wheezing, cough  You may also take acetaminophen (Tylenol) as needed for fever.  Reduce   your risk of any infection by using the same  precautions used for avoiding the common cold or flu:  . Wash your hands often with soap and warm water for at least 20 seconds.  If soap and water are not readily available, use an alcohol-based hand sanitizer with at least 60% alcohol.  . If coughing or sneezing, cover your mouth and nose by coughing or sneezing into the elbow areas of your shirt or coat, into a tissue or into your sleeve (not your hands). . Avoid shaking hands with others and consider head nods or verbal greetings only. . Avoid touching your eyes, nose, or mouth with unwashed hands.  . Avoid close contact with people who are sick. . Avoid places or events with large numbers of people in one location, like concerts or sporting events. . Carefully consider travel plans you have or are making. . If you are planning any travel outside or inside the [place], visit the CDC's Travelers' Health webpage for the latest health notices. . If you have some symptoms but not all symptoms, continue to monitor at home and seek medical attention if your symptoms worsen. . If you are having a medical emergency, call 911.  HOME CARE . Only take medications as instructed by your medical team. . Drink plenty of fluids and get plenty of rest. . A steam or ultrasonic humidifier can help if you have congestion.   GET HELP RIGHT AWAY IF YOU HAVE EMERGENCY WARNING SIGNS** FOR COVID-19. If you or someone is showing any of these signs seek emergency medical care immediately. Call 911 or proceed to your closest emergency facility if: . You develop worsening high fever. . Trouble breathing . Bluish lips or face . Persistent pain or pressure in the chest . New confusion . Inability to wake or stay awake . You cough up blood. . Your symptoms become more severe  **This list is not all possible symptoms. Contact your medical provider for any symptoms that are sever or concerning to you.  MAKE SURE YOU   Understand these instructions.  Will watch your  condition.  Will get help right away if you are not doing well or get worse.  Your e-visit answers were reviewed by a board certified advanced clinical practitioner to complete your personal care plan.  Depending on the condition, your plan could have included both over the counter or prescription medications.  If there is a problem please reply once you have received a response from your provider.  Your safety is important to us.  If you have drug allergies check your prescription carefully.    You can use MyChart to ask questions about today's visit, request a non-urgent call back, or ask for a work or school excuse for 24 hours related to this e-Visit. If it has been greater than 24 hours you will need to follow up with your provider, or enter a new e-Visit to address those concerns. You will get an e-mail in the next two days asking about your experience.  I hope that your e-visit has been valuable and will speed your recovery. Thank you for using e-visits.  Caria Transue PA-C  Approximately 5 minutes was spent documenting and reviewing patient's chart.   

## 2020-01-22 ENCOUNTER — Other Ambulatory Visit: Payer: Self-pay

## 2020-01-22 ENCOUNTER — Emergency Department (HOSPITAL_COMMUNITY): Payer: BC Managed Care – PPO

## 2020-01-22 ENCOUNTER — Inpatient Hospital Stay (HOSPITAL_COMMUNITY)
Admission: EM | Admit: 2020-01-22 | Discharge: 2020-01-26 | DRG: 177 | Disposition: A | Discharge status: Home / Self Care | Payer: BC Managed Care – PPO | Attending: Family Medicine | Admitting: Family Medicine

## 2020-01-22 ENCOUNTER — Encounter (HOSPITAL_COMMUNITY): Payer: Self-pay | Admitting: Emergency Medicine

## 2020-01-22 DIAGNOSIS — F329 Major depressive disorder, single episode, unspecified: Secondary | ICD-10-CM | POA: Diagnosis present

## 2020-01-22 DIAGNOSIS — Z6837 Body mass index (BMI) 37.0-37.9, adult: Secondary | ICD-10-CM | POA: Diagnosis not present

## 2020-01-22 DIAGNOSIS — R739 Hyperglycemia, unspecified: Secondary | ICD-10-CM | POA: Diagnosis present

## 2020-01-22 DIAGNOSIS — Z820 Family history of epilepsy and other diseases of the nervous system: Secondary | ICD-10-CM

## 2020-01-22 DIAGNOSIS — N179 Acute kidney failure, unspecified: Secondary | ICD-10-CM | POA: Diagnosis not present

## 2020-01-22 DIAGNOSIS — G4733 Obstructive sleep apnea (adult) (pediatric): Secondary | ICD-10-CM | POA: Diagnosis not present

## 2020-01-22 DIAGNOSIS — Z823 Family history of stroke: Secondary | ICD-10-CM | POA: Diagnosis not present

## 2020-01-22 DIAGNOSIS — Z833 Family history of diabetes mellitus: Secondary | ICD-10-CM

## 2020-01-22 DIAGNOSIS — Z79899 Other long term (current) drug therapy: Secondary | ICD-10-CM

## 2020-01-22 DIAGNOSIS — Z9119 Patient's noncompliance with other medical treatment and regimen: Secondary | ICD-10-CM | POA: Diagnosis not present

## 2020-01-22 DIAGNOSIS — I10 Essential (primary) hypertension: Secondary | ICD-10-CM | POA: Diagnosis not present

## 2020-01-22 DIAGNOSIS — J1282 Pneumonia due to coronavirus disease 2019: Secondary | ICD-10-CM | POA: Diagnosis present

## 2020-01-22 DIAGNOSIS — Z66 Do not resuscitate: Secondary | ICD-10-CM | POA: Diagnosis present

## 2020-01-22 DIAGNOSIS — U071 COVID-19: Principal | ICD-10-CM

## 2020-01-22 DIAGNOSIS — E669 Obesity, unspecified: Secondary | ICD-10-CM | POA: Diagnosis not present

## 2020-01-22 DIAGNOSIS — J9601 Acute respiratory failure with hypoxia: Secondary | ICD-10-CM

## 2020-01-22 DIAGNOSIS — Z8249 Family history of ischemic heart disease and other diseases of the circulatory system: Secondary | ICD-10-CM

## 2020-01-22 DIAGNOSIS — R0902 Hypoxemia: Secondary | ICD-10-CM

## 2020-01-22 DIAGNOSIS — R0602 Shortness of breath: Secondary | ICD-10-CM | POA: Diagnosis not present

## 2020-01-22 HISTORY — DX: COVID-19: U07.1

## 2020-01-22 LAB — COMPREHENSIVE METABOLIC PANEL
ALT: 40 U/L (ref 0–44)
AST: 40 U/L (ref 15–41)
Albumin: 3.3 g/dL — ABNORMAL LOW (ref 3.5–5.0)
Alkaline Phosphatase: 45 U/L (ref 38–126)
Anion gap: 13 (ref 5–15)
BUN: 19 mg/dL (ref 6–20)
CO2: 25 mmol/L (ref 22–32)
Calcium: 8.2 mg/dL — ABNORMAL LOW (ref 8.9–10.3)
Chloride: 98 mmol/L (ref 98–111)
Creatinine, Ser: 1.28 mg/dL — ABNORMAL HIGH (ref 0.61–1.24)
GFR calc Af Amer: >60 mL/min (ref >60)
GFR calc non Af Amer: >60 mL/min (ref >60)
Glucose, Bld: 126 mg/dL — ABNORMAL HIGH (ref 70–99)
Potassium: 3.8 mmol/L (ref 3.5–5.1)
Sodium: 136 mmol/L (ref 135–145)
Total Bilirubin: 0.6 mg/dL (ref 0.3–1.2)
Total Protein: 6.8 g/dL (ref 6.5–8.1)

## 2020-01-22 LAB — CBC WITH DIFFERENTIAL/PLATELET
Abs Immature Granulocytes: 0.02 10*3/uL (ref 0.00–0.07)
Basophils Absolute: 0 10*3/uL (ref 0.0–0.1)
Basophils Relative: 0 %
Eosinophils Absolute: 0 10*3/uL (ref 0.0–0.5)
Eosinophils Relative: 0 %
HCT: 41.7 % (ref 39.0–52.0)
Hemoglobin: 12.9 g/dL — ABNORMAL LOW (ref 13.0–17.0)
Immature Granulocytes: 0 %
Lymphocytes Relative: 11 %
Lymphs Abs: 0.7 10*3/uL (ref 0.7–4.0)
MCH: 25.6 pg — ABNORMAL LOW (ref 26.0–34.0)
MCHC: 30.9 g/dL (ref 30.0–36.0)
MCV: 82.7 fL (ref 80.0–100.0)
Monocytes Absolute: 0.5 10*3/uL (ref 0.1–1.0)
Monocytes Relative: 8 %
Neutro Abs: 5.3 10*3/uL (ref 1.7–7.7)
Neutrophils Relative %: 81 %
Platelets: 155 10*3/uL (ref 150–400)
RBC: 5.04 MIL/uL (ref 4.22–5.81)
RDW: 15 % (ref 11.5–15.5)
WBC: 6.6 10*3/uL (ref 4.0–10.5)
nRBC: 0 % (ref 0.0–0.2)

## 2020-01-22 LAB — FIBRINOGEN: Fibrinogen: 646 mg/dL — ABNORMAL HIGH (ref 210–475)

## 2020-01-22 LAB — LACTIC ACID, PLASMA
Lactic Acid, Venous: 1.4 mmol/L (ref 0.5–1.9)
Lactic Acid, Venous: 0.9 mmol/L (ref 0.5–1.9)

## 2020-01-22 LAB — D-DIMER, QUANTITATIVE: D-Dimer, Quant: 0.39 ug/mL-FEU (ref 0.00–0.50)

## 2020-01-22 LAB — LACTATE DEHYDROGENASE: LDH: 331 U/L — ABNORMAL HIGH (ref 98–192)

## 2020-01-22 LAB — C-REACTIVE PROTEIN: CRP: 7.7 mg/dL — ABNORMAL HIGH (ref <1.0)

## 2020-01-22 LAB — TRIGLYCERIDES: Triglycerides: 127 mg/dL (ref <150)

## 2020-01-22 LAB — ABO/RH: ABO/RH(D): O POS

## 2020-01-22 LAB — FERRITIN: Ferritin: 875 ng/mL — ABNORMAL HIGH (ref 24–336)

## 2020-01-22 LAB — HIV ANTIBODY (ROUTINE TESTING W REFLEX): HIV Screen 4th Generation wRfx: NONREACTIVE

## 2020-01-22 LAB — PROCALCITONIN: Procalcitonin: 0.34 ng/mL

## 2020-01-22 MED ORDER — POLYETHYLENE GLYCOL 3350 17 G PO PACK: 17.0000 g | PACK | Freq: Every day | ORAL | Status: DC | PRN

## 2020-01-22 MED ORDER — SODIUM CHLORIDE 0.9 % IV SOLN
200.0000 mg | Freq: Once | INTRAVENOUS | Status: AC
  Administered 2020-01-22: 200 mg via INTRAVENOUS
  Filled 2020-01-22: qty 40

## 2020-01-22 MED ORDER — DEXAMETHASONE SODIUM PHOSPHATE 10 MG/ML IJ SOLN
6.0000 mg | INTRAMUSCULAR | Status: DC
  Administered 2020-01-23 – 2020-01-24 (×2): 6 mg via INTRAVENOUS
  Filled 2020-01-22 (×2): qty 1

## 2020-01-22 MED ORDER — GUAIFENESIN-DM 100-10 MG/5ML PO SYRP
10.0000 mL | ORAL_SOLUTION | ORAL | Status: DC | PRN
  Administered 2020-01-22 – 2020-01-23 (×2): 10 mL via ORAL
  Filled 2020-01-22 (×2): qty 10

## 2020-01-22 MED ORDER — ACETAMINOPHEN 325 MG PO TABS: 650.0000 mg | ORAL_TABLET | Freq: Four times a day (QID) | ORAL | Status: DC | PRN

## 2020-01-22 MED ORDER — SODIUM CHLORIDE 0.9 % IV SOLN: INTRAVENOUS | Status: DC

## 2020-01-22 MED ORDER — ASCORBIC ACID 500 MG PO TABS
500.0000 mg | ORAL_TABLET | Freq: Every day | ORAL | Status: DC
  Administered 2020-01-22 – 2020-01-26 (×5): 500 mg via ORAL
  Filled 2020-01-22 (×5): qty 1

## 2020-01-22 MED ORDER — AMLODIPINE BESYLATE 10 MG PO TABS
10.0000 mg | ORAL_TABLET | Freq: Every day | ORAL | Status: DC
  Administered 2020-01-23 – 2020-01-24 (×2): 10 mg via ORAL
  Filled 2020-01-22 (×2): qty 1

## 2020-01-22 MED ORDER — LORATADINE 10 MG PO TABS
10.0000 mg | ORAL_TABLET | Freq: Every day | ORAL | Status: DC
  Administered 2020-01-23 – 2020-01-26 (×4): 10 mg via ORAL
  Filled 2020-01-22 (×4): qty 1

## 2020-01-22 MED ORDER — DEXAMETHASONE SODIUM PHOSPHATE 10 MG/ML IJ SOLN
10.0000 mg | Freq: Once | INTRAMUSCULAR | Status: AC
  Administered 2020-01-22: 10 mg via INTRAVENOUS
  Filled 2020-01-22: qty 1

## 2020-01-22 MED ORDER — ENOXAPARIN SODIUM 40 MG/0.4ML ~~LOC~~ SOLN
40.0000 mg | SUBCUTANEOUS | Status: DC
  Administered 2020-01-22: 40 mg via SUBCUTANEOUS
  Filled 2020-01-22: qty 0.4

## 2020-01-22 MED ORDER — SODIUM CHLORIDE 0.9 % IV SOLN
100.0000 mg | Freq: Every day | INTRAVENOUS | Status: AC
  Administered 2020-01-23 – 2020-01-26 (×4): 100 mg via INTRAVENOUS
  Filled 2020-01-22 (×4): qty 20

## 2020-01-22 MED ORDER — ZINC SULFATE 220 (50 ZN) MG PO CAPS
220.0000 mg | ORAL_CAPSULE | Freq: Every day | ORAL | Status: DC
  Administered 2020-01-22 – 2020-01-26 (×5): 220 mg via ORAL
  Filled 2020-01-22 (×5): qty 1

## 2020-01-22 MED ORDER — HYDROCOD POLST-CPM POLST ER 10-8 MG/5ML PO SUER: 5.0000 mL | Freq: Two times a day (BID) | ORAL | Status: DC | PRN

## 2020-01-22 MED ORDER — CHLORTHALIDONE 25 MG PO TABS
25.0000 mg | ORAL_TABLET | Freq: Every day | ORAL | Status: DC
  Administered 2020-01-23 – 2020-01-24 (×2): 25 mg via ORAL
  Filled 2020-01-22 (×2): qty 1

## 2020-01-22 MED ORDER — METOPROLOL SUCCINATE ER 25 MG PO TB24
25.0000 mg | ORAL_TABLET | Freq: Every day | ORAL | Status: DC
  Administered 2020-01-22 – 2020-01-25 (×4): 25 mg via ORAL
  Filled 2020-01-22 (×4): qty 1

## 2020-01-22 NOTE — ED Provider Notes (Signed)
[facility] EMERGENCY DEPARTMENT Provider Note   CSN: [id number] Arrival date & time: 01/22/20  1135     History Chief Complaint  Patient presents with  . COVID +  . Cough  . Shortness of Breath    Austin [name] is a 39 y.o. male.  39 year old male presents with complaint of shortness of breath and nonproductive cough.  Patient states that he had had 1 dose of Covid vaccine, attended a funeral and several of the people from the funeral tested positive for Covid.  Patient reports developing symptoms around January 16, went to the [facility] and had a positive Covid test.  Patient has been monitoring his symptoms at home with a home pulse ox, states that he is becoming more short of breath and his oxygen saturation yesterday was around 85%.  Patient had a virtual visit and was advised to come to the emergency room.  Patient denies history of asthma or chronic lung disease, is a non-smoker, no improvement with Tessalon and albuterol.  Patient has a history of hypertension, no history of diabetes.  No other complaints or concerns.  Austin [name] was evaluated in Emergency Department on 01/22/2020 for the symptoms described in the history of present illness. He was evaluated in the context of the global COVID-19 pandemic, which necessitated consideration that the patient might be at risk for infection with the SARS-CoV-2 virus that causes COVID-19. Institutional protocols and algorithms that pertain to the evaluation of patients at risk for COVID-19 are in a state of rapid change based on information released by regulatory bodies including the CDC and federal and state organizations. These policies and algorithms were followed during the patient's care in the ED.         Past Medical History:  Diagnosis Date  . COVID-19   . Essential hypertension 09/18/2017  . Hypertension     Patient Active Problem List   Diagnosis Date Noted  . 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 Essential hypertension 09/18/2017  . Snoring 11/27/2016  . Excessive daytime sleepiness 11/27/2016  . OSA (obstructive sleep apnea) 11/27/2016  . Morbid obesity (HCC) 11/27/2016    Past Surgical History:  Procedure Laterality Date  . KNEE ARTHROSCOPY WITH ANTERIOR CRUCIATE LIGAMENT (ACL) REPAIR         Family History  Problem Relation Age of Onset  . Diabetes Mother   . Healthy Sister   . Healthy Brother   . Hypertension Maternal Grandmother   . Stroke Maternal Grandmother   . Multiple sclerosis Father     Social History   Tobacco Use  . Smoking status: Never Smoker  . Smokeless tobacco: Never Used  Substance Use Topics  . Alcohol use: Yes    Alcohol/week: 2.0 standard drinks    Types: 2 Standard drinks or equivalent per week  . Drug use: No    Home Medications Prior to Admission medications   Medication Sig Start Date End Date Taking? Authorizing Provider  albuterol (VENTOLIN HFA) 108 (90 Base) MCG/ACT inhaler Inhale 2 puffs into the lungs every 6 (six) hours as needed for wheezing or shortness of breath. 01/20/20  Yes Remus Loffler, PA-C  amLODipine (NORVASC) 10 MG tablet Take 1 tablet (10 mg total) by mouth daily. 04/14/19  Yes Dorothyann Peng, MD  benzonatate (TESSALON) 100 MG capsule Take 1-2 capsules (100-200 mg total) by mouth 3 (three) times daily as needed for cough. 01/20/20  Yes Remus Loffler, PA-C  chlorthalidone (  HYGROTON) 25 MG tablet Take 1 tablet (25 mg total) by mouth daily. Please schedule appointment for refills. Patient taking differently: Take 25 mg by mouth daily.  10/07/19  Yes Chilton Si, MD  fluticasone [facility]) 50 MCG/ACT nasal spray Place 1 spray into both nostrils daily.    Yes [provider]  irbesartan (AVAPRO) 300 MG tablet Take 1 tablet (300 mg total) by mouth daily. NEED OV. Patient taking differently: Take 300 mg by mouth daily.  04/14/19  Yes Dorothyann Peng, MD  loratadine (CLARITIN) 10 MG  tablet Take 10 mg by mouth daily.   Yes [provider]  MAGNESIUM CITRATE PO Take 500 mg by mouth at bedtime.    Yes [provider]  metoprolol succinate (TOPROL-XL) 25 MG 24 hr tablet Take 1 tablet (25 mg total) by mouth at bedtime. 04/14/19  Yes Dorothyann Peng, MD  modafinil (PROVIGIL) 200 MG tablet TAKE 1 TABLET BY MOUTH DAILY Patient taking differently: Take 200 mg by mouth daily as needed.  09/30/19  Yes Dohmeier, Porfirio Mylar, MD  spironolactone (ALDACTONE) 25 MG tablet Take 1 tablet (25 mg total) by mouth daily. Please schedule appointment with Dr. Duke Salvia for refills. Patient taking differently: Take 25 mg by mouth daily.  04/14/19  Yes Dorothyann Peng, MD    Allergies    Patient has no known allergies.  Review of Systems   Review of Systems  Constitutional: Positive for fever.  HENT: Positive for congestion. Negative for sore throat.   Respiratory: Positive for cough and shortness of breath.   Cardiovascular: Negative for chest pain.  Gastrointestinal: Negative for constipation, diarrhea, nausea and vomiting.  Genitourinary: Negative for difficulty urinating.  Musculoskeletal: Negative for arthralgias and myalgias.  Skin: Negative for rash and wound.  Allergic/Immunologic: Negative for immunocompromised state.  Neurological: Negative for weakness.  Hematological: Negative for adenopathy.  Psychiatric/Behavioral: Negative for confusion.  All other systems reviewed and are negative.   Physical Exam Updated Vital Signs BP 119/69   Pulse 69   Temp 99.8 F (37.7 C) (Oral)   Resp (!) 35   SpO2 95%   Physical Exam Vitals and nursing note reviewed.  Constitutional:      General: He is not in acute distress.    Appearance: He is well-developed. He is not diaphoretic.  HENT:     Head: Normocephalic and atraumatic.  Cardiovascular:     Rate and Rhythm: Normal rate and regular rhythm.  Pulmonary:     Effort: Tachypnea present. No respiratory distress.      Breath sounds: Examination of the right-lower field reveals decreased breath sounds. Examination of the left-lower field reveals decreased breath sounds. Decreased breath sounds present.  Abdominal:     Palpations: Abdomen is soft.     Tenderness: There is no abdominal tenderness.  Musculoskeletal:     Cervical back: Neck supple.     Right lower leg: No edema.     Left lower leg: No edema.  Skin:    General: Skin is warm and dry.  Neurological:     Mental Status: He is alert and oriented to person, place, and time.  Psychiatric:        Behavior: Behavior normal.   Media Information   Document Information  Photos    01/22/2020 11:59  Attached To:  Hospital Encounter on 01/22/20  Source Information  [facility]  [facility]     ED Results / Procedures / Treatments   Labs (all labs ordered are listed, but  only abnormal results are displayed) Labs Reviewed  CBC WITH DIFFERENTIAL/PLATELET - Abnormal; Notable for the following components:      Result Value   Hemoglobin 12.9 (*)    MCH 25.6 (*)    All other components within normal limits  COMPREHENSIVE METABOLIC PANEL - Abnormal; Notable for the following components:   Glucose, Bld 126 (*)    Creatinine, Ser 1.28 (*)    Calcium 8.2 (*)    Albumin 3.3 (*)    All other components within normal limits  LACTATE DEHYDROGENASE - Abnormal; Notable for the following components:   LDH 331 (*)    All other components within normal limits  FERRITIN - Abnormal; Notable for the following components:   Ferritin 875 (*)    All other components within normal limits  FIBRINOGEN - Abnormal; Notable for the following components:   Fibrinogen 646 (*)    All other components within normal limits  C-REACTIVE PROTEIN - Abnormal; Notable for the following components:   CRP 7.7 (*)    All other components within normal limits  CULTURE, BLOOD (ROUTINE X 2)  CULTURE, BLOOD (ROUTINE X 2)  LACTIC ACID, PLASMA  D-DIMER,  QUANTITATIVE (NOT AT [facility])  PROCALCITONIN  TRIGLYCERIDES  LACTIC ACID, PLASMA    EKG EKG Interpretation  Date/Time:  Saturday January 22 2020 11:57:29 EST Ventricular Rate:  82 PR Interval:    QRS Duration: 96 QT Interval:  368 QTC Calculation: 430 R Axis:   -7 Text Interpretation: Sinus rhythm Low voltage, precordial leads No STEMI Confirmed by Nanda Quinton [phone]) on 01/22/2020 3:06:47 PM   Radiology DG Chest Port 1 View  Result Date: 01/22/2020 CLINICAL DATA:  39 year old male with shortness of breath. COVID diagnosis positive 01/18/2020 EXAM: PORTABLE CHEST 1 VIEW COMPARISON:  04/23/2016 FINDINGS: Cardiomediastinal silhouette unchanged in size and contour. Low lung volumes with reticular opacities bilaterally. No pneumothorax or large pleural effusion. IMPRESSION: Low lung volumes with reticulonodular opacities compatible with multifocal infection and the given history Electronically Signed   By: Corrie Mckusick D.O.   On: 01/22/2020 12:29    Procedures .Critical Care Performed by: Tacy Learn, PA-C Authorized by: Tacy Learn, PA-C   Critical care provider statement:    Critical care time (minutes):  45   Critical care was time spent personally by me on the following activities:  Discussions with consultants, evaluation of patient's response to treatment, examination of patient, ordering and performing treatments and interventions, ordering and review of laboratory studies, ordering and review of radiographic studies, pulse oximetry, re-evaluation of patient's condition, obtaining history from patient or surrogate and review of old charts   (including critical care time)  Medications Ordered in ED Medications  dexamethasone (DECADRON) injection 10 mg (10 mg Intravenous Given 01/22/20 1338)    ED Course  I have reviewed the triage vital signs and the nursing notes.  Pertinent labs & imaging results that were available during my care of the patient were reviewed by  me and considered in my medical decision making (see chart for details).  Clinical Course as of Jan 21 1506  Sat Jan 21, 7642  7311 39 year old male with known history of positive Covid test presents with worsening shortness of breath.  Oxygen saturation on room air on arrival of 87%, improved to 94% on 2 L nasal cannula.  Patient is tachypneic, breath sounds diminished in the bases, does not appear to be in distress.  Chest x-ray consistent with Covid.  Patient was given Decadron, plan  for admission when labs return.   [LM]  1506 Case discussed with hospitalist who will consult for admission.    [LM]    Clinical Course User Index [LM] Alden Hipp   MDM Rules/Calculators/A&P                      Final Clinical Impression(s) / ED Diagnoses Final diagnoses:  COVID-19  Hypoxemia    Rx / DC Orders ED Discharge Orders    None       Jeannie Fend, PA-C 01/22/20 1507    Long, Arlyss Repress, MD 01/22/20 1918

## 2020-01-22 NOTE — H&P (Addendum)
Family Medicine Teaching [facility] Admission History and Physical Service Pager: [phone]  Patient name: Austin [name] Medical record number: [id number] Date of birth: May 24, 1981 Age: 39 y.o. Gender: male  Primary Care Provider: Dorothyann Peng, MD Consultants: None Code Status: DO NOT RESUSCITATE Preferred Emergency Contact: Patient's wife, Nidra Whitebread  Chief Complaint: Hypoxia, dyspnea  Assessment and Plan: Austin [name] is a 39 y.o. male presenting with. PMH is significant for hypertension, elevated BMI, OSA.   Acute hypoxic respiratory failure 2/2 Covid PNA: Stable. Patient tested positive for Covid on 01/17/2020 after attending a funeral.  Patient states that he had first dose of Covid vaccine earlier in this month as well.  Patient works with [employer].  Patient states that he began to feel sick about a week after attending the funeral (around 15/16th). Patient states that he uses home pulse ox and learned that his saturation levels were decreasing day by day starting off at 92% and decreasing to less than 90 as of yesterday and this morning. In the ED patient found to have desaturations to high 80s on room air, tachypneic to 35 with lactic acid 1.4, procalcitonin 0.34, CRP elevated at 7.7, ferritin elevated at 875, LDH elevated at 331, fibrinogen elevated at 646, and D-dimer is 0.39.  No leukocytosis noted on admission.  Chest x-ray shows low lung volumes with reticulonodular opacities multifocal infection. Patient is afebrile on admission.  Mild WOB noted at rest through exam including exam notable for fine crackles in mid to lower lung fields, frequent coughing during exam, on 2 L via nasal cannula, takes breaks after speaking full sentences. Given patient's symptoms and presentation most consistent with Covid pneumonia.  Will admit to cardiac telemetry for IV remdesivir and Decadron and monitor of respiratory status.  Despite known Covid hypercoagulability, doubt PE with  Wells score for PE of 0.  Considered concurrent bacterial pneumonia, however with relatively low Procalcitonin and CXR consistent with COVID, will continue to monitor without antibiotics. -Admit to MedSurg, attending Dr. Pollie Meyer -Continue Decadron 6 mg every 24 hours -Start remdesivir, per pharmacy -Airborne and contact precautions -A.m. CBC and BMP -Trend D-dimer, LDH, CRP -Maintain oxygen saturation greater than 94% -PT/OT -Vitals every 4 hours -Maintain prone position as long as he can tolerate this -Vitamin C and zinc -Robitussin/Tussionex as needed for cough  Resistant hypertension: Chronic, stable. Blood pressure within normal limits on initial presentation at 119/69, took all of his home medications today with the exception of spironolactone/metoprolol.  Patient's home medications include amlodipine 10, chlorthalidone 25, metoprolol 25, Avapro 300, and spironolactone 25 mg.  Follows with Dr. Duke Salvia, cardiology. -Continue home amlodipine, chlorthalidone, metoprolol -Held home irbesartan and spironolactone to assess creatinine trend/blood pressure stability especially as normotensive for now, if maintaining well could add back on to receive tomorrow  Elevated creatinine: Patient with creatinine elevated at 1.28 on admission. While his last Cr was 1.20 in 08/2019, appears his baseline is around 1.0.  -IVF at 75 mL/hour over night, likely transition to oral hydration tomorrow -BMP in a.m.  Elevated glucose Glucose 128.  Hemoglobin A1c 6.5 x 1 in 08/2019 without established diagnosis of type 2 diabetes. -Measure hemoglobin A1c -CBG with daily BMPs  OSA: Chronic, stable.  Appears to be non-compliant with CPAP at home. Will clarify with patient tomorrow.   Depressed mood and affect:  Noted to have flat and depressed mood/affect during conversation.  May be reflection of his stress surrounding his current illness.  Additionally requested DNR status as  an otherwise relatively healthy  39 year old gentleman, he is an EMS provider, may have experienced numerous poor outcomes after codes. Recommend continued assessment and monitoring mental status.  FEN/GI: Heart healthy Prophylaxis: Lovenox 40  Disposition: Admit to cardiac telemetry, attending Dr. Pollie Meyer  History of Present Illness:  Austin [name] is a 39 y.o. male with a history of hypertension and OSA presenting with desaturating oxygen percentages and increasing shortness of breath on exertion with recent Covid diagnosis.  Austin [name] states that he attended a funeral on 01/07/2020 and afterwards multiple attendees tested positive for Covid.  He reports that he started feeling sick around the 15th/16th and noticed that his oxygen saturation levels continued to decrease to 80s on his home pusle ox while at rest, lowest recorded was 85%.  His test for Covid was positive on 01/17/2020.  Today, patient reports feeling more dyspneic with movement as well as a persistent cough that became productive overnight.  Sputum is described as brown in color but no obvious red signifying blood.  Patient also reports low grade fever of 100.49F.  On review of system he denies any HA, sore throat, muscle aches, chest pain, abdominal pain, nausea or vomiting, lack of taste or smell.   In the ED, he was hemodynamically stable with the exception of oxygen saturations to upper 80s and placed on 2L nasal cannula.  Started on Decadron 10 mg.   Review Of Systems: Per HPI with the following additions:   Review of Systems  Constitutional: Negative for chills.  HENT: Negative for ear pain and sore throat.   Eyes: Negative for blurred vision.  Respiratory: Positive for cough, sputum production and shortness of breath.   Cardiovascular: Negative for chest pain and leg swelling.  Gastrointestinal: Negative for abdominal pain, constipation, diarrhea, nausea and vomiting.  Genitourinary: Negative for dysuria.  Skin: Negative for itching and rash.   Neurological: Positive for dizziness and headaches.    Patient Active Problem List   Diagnosis Date Noted  . Acute hypoxemic respiratory failure due to COVID-19 (HCC) 01/22/2020  . 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 Essential hypertension 09/18/2017  . Snoring 11/27/2016  . Excessive daytime sleepiness 11/27/2016  . OSA (obstructive sleep apnea) 11/27/2016  . Morbid obesity (HCC) 11/27/2016    Past Medical History: Past Medical History:  Diagnosis Date  . COVID-19   . Essential hypertension 09/18/2017  . Hypertension     Past Surgical History: Past Surgical History:  Procedure Laterality Date  . KNEE ARTHROSCOPY WITH ANTERIOR CRUCIATE LIGAMENT (ACL) REPAIR      Social History: Social History   Tobacco Use  . Smoking status: Never Smoker  . Smokeless tobacco: Never Used  Substance Use Topics  . Alcohol use: Yes    Alcohol/week: 2.0 standard drinks    Types: 2 Standard drinks or equivalent per week  . Drug use: No    Family History: Family History  Problem Relation Age of Onset  . Diabetes Mother   . Healthy Sister   . Healthy Brother   . Hypertension Maternal Grandmother   . Stroke Maternal Grandmother   . Multiple sclerosis Father    Allergies and Medications: No Known Allergies No current facility-administered medications on file prior to encounter.   Current Outpatient Medications on File Prior to Encounter  Medication Sig Dispense Refill  . albuterol (VENTOLIN HFA) 108 (90 Base) MCG/ACT inhaler Inhale 2 puffs into the lungs every 6 (six) hours as needed for wheezing or shortness of  breath. 18 g 0  . amLODipine (NORVASC) 10 MG tablet Take 1 tablet (10 mg total) by mouth daily. 90 tablet 1  . benzonatate (TESSALON) 100 MG capsule Take 1-2 capsules (100-200 mg total) by mouth 3 (three) times daily as needed for cough. 40 capsule 0  . chlorthalidone (HYGROTON) 25 MG tablet Take 1 tablet (25 mg total) by mouth daily. Please  schedule appointment for refills. (Patient taking differently: Take 25 mg by mouth daily. ) 90 tablet 1  . fluticasone (FLONASE) 50 MCG/ACT nasal spray Place 1 spray into both nostrils daily.     . irbesartan (AVAPRO) 300 MG tablet Take 1 tablet (300 mg total) by mouth daily. NEED OV. (Patient taking differently: Take 300 mg by mouth daily. ) 90 tablet 1  . loratadine (CLARITIN) 10 MG tablet Take 10 mg by mouth daily.    Marland Kitchen MAGNESIUM CITRATE PO Take 500 mg by mouth at bedtime.     . metoprolol succinate (TOPROL-XL) 25 MG 24 hr tablet Take 1 tablet (25 mg total) by mouth at bedtime. 90 tablet 1  . modafinil (PROVIGIL) 200 MG tablet TAKE 1 TABLET BY MOUTH DAILY (Patient taking differently: Take 200 mg by mouth daily as needed. ) 30 tablet 5  . spironolactone (ALDACTONE) 25 MG tablet Take 1 tablet (25 mg total) by mouth daily. Please schedule appointment with Dr. Duke Salvia for refills. (Patient taking differently: Take 25 mg by mouth daily. ) 90 tablet 1    Objective: BP 124/72   Pulse 67   Temp 98.2 F (36.8 C) (Oral)   Resp (!) 25   Wt 111.1 kg   SpO2 95%   BMI 37.25 kg/m   Exam: General: Obese male sitting up in bed, uncomfortable appearing Eyes: No scleral icterus extraocular muscles intact bilaterally Cardiovascular: Regular rate and rhythm without murmurs, gallops or friction rubs, bilateral radial pulses palpated Respiratory: Fine crackles in mid to lower lung fields bilaterally, patient on 2 L via nasal cannula, no retractions appreciated, patient taken break to breathe after speaking full sentences, no wheezing Gastrointestinal: No tenderness to palpation, obese abdomen, bowel sounds appreciated MSK: Patient moves all extremities Derm: No lesions or ulcerations appreciated Neuro: Alert and oriented x4 Psych: Responds appropriate to questions and is cooperative with exam no pressured speech  Labs and Imaging: CBC BMET  Recent Labs  Lab 01/22/20 1255  WBC 6.6  HGB 12.9*  HCT  41.7  PLT 155   Recent Labs  Lab 01/22/20 1255  NA 136  K 3.8  CL 98  CO2 25  BUN 19  CREATININE 1.28*  GLUCOSE 126*  CALCIUM 8.2*     EKG: Sinus rhythm, QTc 430  DG Chest Port 1 View  Result Date: 01/22/2020 CLINICAL DATA:  39 year old male with shortness of breath. COVID diagnosis positive 01/18/2020 EXAM: PORTABLE CHEST 1 VIEW COMPARISON:  04/23/2016 FINDINGS: Cardiomediastinal silhouette unchanged in size and contour. Low lung volumes with reticular opacities bilaterally. No pneumothorax or large pleural effusion. IMPRESSION: Low lung volumes with reticulonodular opacities compatible with multifocal infection and the given history Electronically Signed   By: Gilmer Mor D.O.   On: 01/22/2020 12:29     Nicki Guadalajara, MD 01/22/2020, 7:20 PM PGY-1, [facility] Family Medicine FPTS Intern pager: [phone], text pages welcome  FPTS Upper-Level Resident Addendum   I have independently interviewed and examined the patient. I have discussed the above with the original author and agree with their documentation. My edits for correction/addition/clarification are in green. Please  see also any attending notes.    Patriciaann Clan, DO  Family Medicine PGY-2

## 2020-01-22 NOTE — ED Triage Notes (Signed)
Diagnosed with COVID on 1/19.  C/o worsening cough with brown phlegm, pulse ox less than 90% at home for a few days, SOB, and dizziness.

## 2020-01-22 NOTE — Discharge Summary (Addendum)
Family Medicine Teaching [facility] Discharge Summary  Patient name: Austin [name] Medical record number: [id number] Date of birth: Apr 24, 1981 Age: 39 y.o. Gender: male Date of Admission: 01/22/2020  Date of Discharge: 01/26/2020 Admitting Physician: Nicki Guadalajara, MD  Primary Care Provider: Dorothyann Peng, MD Consultants: None  Indication for Hospitalization: COVID PNA  Discharge Diagnoses/Problem List:  Active Problems:   Acute hypoxemic respiratory failure due to COVID-19 [facility])   Acute respiratory failure with hypoxia [facility])  Disposition: Home  Discharge Condition: Stable  Discharge Exam:  Blood pressure (!) 133/97, pulse 76, temperature 98 F (36.7 C), temperature source Oral, resp. rate 18, height 5\' 9"  (1.753 m), weight 111.1 kg, SpO2 98 %. General: Alert and oriented in no apparent distress Heart: RRR with no murmurs appreciated Lungs: CTA bilaterally, on 2L [place]  Abdomen: Bowel sounds present, no abdominal pain Skin: Warm and dry Extremities: No lower extremity edema  Brief Hospital Course:   Austin [name] is a 39 y.o. male who presented with increasing SOB and decreasing oxygen saturation on home pulse ox in setting of COVID infection. PMH is significant for HTN.   Covid PNA Tested positive on 01/17/2020, sx onset ~1/16.  Patient states that he had first dose of Covid vaccine earlier in this month as well. Patient noticed on his home pulse ox that he had saturations in the 80s and was increasingly feeling SOB.  In the ED patient found to have desaturations to high 80s on room air, tachypneic to 35 with lactic acid 1.4, procalcitonin 0.34, CRP elevated at 7.7, ferritin elevated at 875, LDH elevated at 331, fibrinogen elevated at 646, of note D-dimer is 0.39. CXR showed low lung volumes with reticulonodular opacities consistent with multifocal infection. Patient was afebrile on admission. Initially with mild WOB on 2L [place] at admission, during his stay required up to 15L  HFNC, but was able to wean back to ~2L w/ rest and ambulation by discharge.  He was treated with IV remdesivir (completing a 5-day course during his hospitalization) and Decadron (10-day course) as well as kept on supplemental oxygen. DME order for home oxygen was placed to assist patient in continued recovery from COVID-19.  Issues for Follow Up:  1.    Monitor respiratory status, requiring 2L O2 at rest and with ambulation on discharge. Recommended 21 day isolation from 01/17/2020.  2.   Recommend PCP recheck BMP for creatinine, mild AKI during his stay that improved with IVF. Cr 1.14 on d/c (BL seems ~ 1.0).   3.   Monitor BP: Normotensive during stay with home Toprol only. Discharged on Norvasc and Toprol at discharge, please consider restarting his home chlorthalidone, Avapro, spironolactone as appropriate.  4.   Patient not immune to hepatitis B, recommend discussion on Hep B vaccination.  5.   A1c 6.5 in 08/2019, please make sure he follows up for confirmatory testing/consider starting metformin for further control.   Significant Procedures: None  Significant Labs and Imaging:  Recent Labs  Lab 01/22/20 1255 01/24/20 0639 01/25/20 0719  WBC 6.6 11.5* 11.0*  HGB 12.9* 13.9 14.1  HCT 41.7 44.0 45.2  PLT 155 212 263   Recent Labs  Lab 01/22/20 1255 01/22/20 1255 01/23/20 0345 01/23/20 0345 01/24/20 0639 01/25/20 0719  NA 136  --  137  --  138 138  K 3.8   < > 4.2   < > 4.8 4.3  CL 98  --  100  --  100 99  CO2 25  --  27  --  26 27  GLUCOSE 126*  --  158*  --  155* 189*  BUN 19  --  21*  --  29* 30*  CREATININE 1.28*  --  1.04  --  1.17 1.14  CALCIUM 8.2*  --  8.3*  --  8.6* 8.6*  ALKPHOS 45  --   --   --  51  --   AST 40  --   --   --  37  --   ALT 40  --   --   --  47*  --   ALBUMIN 3.3*  --   --   --  3.0*  --    < > = values in this interval not displayed.    Results/Tests Pending at Time of Discharge: None  Discharge Medications:  Allergies as of 01/26/2020   No  Known Allergies     Medication List    STOP taking these medications   chlorthalidone 25 MG tablet Commonly known as: HYGROTON   fluticasone 50 MCG/ACT nasal spray Commonly known as: FLONASE   irbesartan 300 MG tablet Commonly known as: AVAPRO   spironolactone 25 MG tablet Commonly known as: ALDACTONE     TAKE these medications   albuterol 108 (90 Base) MCG/ACT inhaler Commonly known as: VENTOLIN HFA Inhale 2 puffs into the lungs every 6 (six) hours as needed for wheezing or shortness of breath.   amLODipine 10 MG tablet Commonly known as: NORVASC Take 1 tablet (10 mg total) by mouth daily.   benzonatate 100 MG capsule Commonly known as: TESSALON Take 1-2 capsules (100-200 mg total) by mouth 3 (three) times daily as needed for cough.   dexamethasone 6 MG tablet Commonly known as: DECADRON Take 1 tablet (6 mg total) by mouth daily.   loratadine 10 MG tablet Commonly known as: CLARITIN Take 10 mg by mouth daily.   MAGNESIUM CITRATE PO Take 500 mg by mouth at bedtime.   metoprolol succinate 25 MG 24 hr tablet Commonly known as: TOPROL-XL Take 1 tablet (25 mg total) by mouth at bedtime.   modafinil 200 MG tablet Commonly known as: PROVIGIL TAKE 1 TABLET BY MOUTH DAILY What changed:   when to take this  reasons to take this       Discharge Instructions: Please refer to Patient Instructions section of EMR for full details.  Patient was counseled important signs and symptoms that should prompt return to medical care, changes in medications, dietary instructions, activity restrictions, and follow up appointments.   Follow-Up Appointments: Follow-up Information    Glendale Chard, MD. Schedule an appointment as soon as possible for a visit.   Specialty: Internal Medicine Why: Please make appointment to see your PCP for follow-up in the next several days Contact information: [address] [place] [address] [phone]         [place] Follow up.   Why: home oxygen           Lurline Del, DO 01/27/2020, 9:59 AM PGY-1, [place] Upper-Level Resident Addendum    I have discussed the above with the original author and agree with their documentation. My edits for correction/addition/clarification are added. Please see also any attending notes.    Patriciaann Clan, DO  Family Medicine PGY-2

## 2020-01-23 DIAGNOSIS — E669 Obesity, unspecified: Secondary | ICD-10-CM | POA: Diagnosis present

## 2020-01-23 DIAGNOSIS — G4733 Obstructive sleep apnea (adult) (pediatric): Secondary | ICD-10-CM | POA: Diagnosis present

## 2020-01-23 DIAGNOSIS — Z66 Do not resuscitate: Secondary | ICD-10-CM | POA: Diagnosis present

## 2020-01-23 DIAGNOSIS — Z833 Family history of diabetes mellitus: Secondary | ICD-10-CM | POA: Diagnosis not present

## 2020-01-23 DIAGNOSIS — J9601 Acute respiratory failure with hypoxia: Secondary | ICD-10-CM | POA: Diagnosis present

## 2020-01-23 DIAGNOSIS — R0902 Hypoxemia: Secondary | ICD-10-CM

## 2020-01-23 DIAGNOSIS — R739 Hyperglycemia, unspecified: Secondary | ICD-10-CM | POA: Diagnosis present

## 2020-01-23 DIAGNOSIS — N179 Acute kidney failure, unspecified: Secondary | ICD-10-CM | POA: Diagnosis present

## 2020-01-23 DIAGNOSIS — U071 COVID-19: Secondary | ICD-10-CM | POA: Diagnosis present

## 2020-01-23 DIAGNOSIS — F329 Major depressive disorder, single episode, unspecified: Secondary | ICD-10-CM | POA: Diagnosis present

## 2020-01-23 DIAGNOSIS — Z6837 Body mass index (BMI) 37.0-37.9, adult: Secondary | ICD-10-CM | POA: Diagnosis not present

## 2020-01-23 DIAGNOSIS — Z823 Family history of stroke: Secondary | ICD-10-CM | POA: Diagnosis not present

## 2020-01-23 DIAGNOSIS — I10 Essential (primary) hypertension: Secondary | ICD-10-CM | POA: Diagnosis present

## 2020-01-23 DIAGNOSIS — J1282 Pneumonia due to coronavirus disease 2019: Secondary | ICD-10-CM | POA: Diagnosis present

## 2020-01-23 DIAGNOSIS — Z79899 Other long term (current) drug therapy: Secondary | ICD-10-CM | POA: Diagnosis not present

## 2020-01-23 DIAGNOSIS — Z820 Family history of epilepsy and other diseases of the nervous system: Secondary | ICD-10-CM | POA: Diagnosis not present

## 2020-01-23 DIAGNOSIS — Z8249 Family history of ischemic heart disease and other diseases of the circulatory system: Secondary | ICD-10-CM | POA: Diagnosis not present

## 2020-01-23 DIAGNOSIS — Z9119 Patient's noncompliance with other medical treatment and regimen: Secondary | ICD-10-CM | POA: Diagnosis not present

## 2020-01-23 LAB — HEPATITIS B SURFACE ANTIGEN: Hepatitis B Surface Ag: NONREACTIVE

## 2020-01-23 LAB — BASIC METABOLIC PANEL
Anion gap: 10 (ref 5–15)
BUN: 21 mg/dL — ABNORMAL HIGH (ref 6–20)
CO2: 27 mmol/L (ref 22–32)
Calcium: 8.3 mg/dL — ABNORMAL LOW (ref 8.9–10.3)
Chloride: 100 mmol/L (ref 98–111)
Creatinine, Ser: 1.04 mg/dL (ref 0.61–1.24)
GFR calc Af Amer: >60 mL/min (ref >60)
GFR calc non Af Amer: >60 mL/min (ref >60)
Glucose, Bld: 158 mg/dL — ABNORMAL HIGH (ref 70–99)
Potassium: 4.2 mmol/L (ref 3.5–5.1)
Sodium: 137 mmol/L (ref 135–145)

## 2020-01-23 LAB — D-DIMER, QUANTITATIVE: D-Dimer, Quant: <0.27 ug/mL-FEU (ref 0.00–0.50)

## 2020-01-23 LAB — LACTATE DEHYDROGENASE: LDH: 265 U/L — ABNORMAL HIGH (ref 98–192)

## 2020-01-23 LAB — C-REACTIVE PROTEIN: CRP: 6.7 mg/dL — ABNORMAL HIGH (ref <1.0)

## 2020-01-23 MED ORDER — ENOXAPARIN SODIUM 60 MG/0.6ML ~~LOC~~ SOLN
55.0000 mg | SUBCUTANEOUS | Status: DC
  Administered 2020-01-23 – 2020-01-25 (×3): 55 mg via SUBCUTANEOUS
  Filled 2020-01-23 (×3): qty 0.6

## 2020-01-23 NOTE — Progress Notes (Signed)
CPAP is not available for COVID+ patient. RT will monitor as needed.

## 2020-01-23 NOTE — Progress Notes (Addendum)
0130: Pt called RN into room stating he was "freezing". Pt noticiably shivering. Given warm blankets. Temp 98.2 orally. O2 sat 88-90%, RR 30s, increased oxygen from 4L to 6L [place]. Pt has known hx of OSA. No other complaints other than cold. Notified MD.   0200: Changed to HFNC at 8 L. Pt in modified prone position. Agreeable to CPAP if available. States he is starting to feel better.   0250: CPAP not available, increased to 15L HFNC while sleeping

## 2020-01-23 NOTE — Progress Notes (Addendum)
Family Medicine Teaching Service Daily Progress Note Intern Pager: [phone]  Patient name: Austin [name] Medical record number: [id number] Date of birth: [birth year] Age: 39 y.o. Gender: male  Primary Care Provider: Dorothyann Peng, MD Consultants: None Code Status: Partial - No CPR/Defib, okay to intubate  Pt Overview and Major Events to Date:  1/23-admitted  Assessment and Plan: Austin [name] is a 39 y.o. male presenting with acute hypoxic respiratory failure secondary to Covid pneumonia. PMH is significant for hypertension, elevated BMI, OSA.   Acute hypoxic respiratory failure secondary to Covid pneumonia Patient positive for Covid as of 01/17/2020.  Patient had received first dose of Covid vaccine earlier this month.  In the ED had desats into the high 80s on room air with elevated inflammatory markers.  Day 2/10 of Decadron, day 2/5 of remdesivir. -Continue Decadron over 24-hour 6 mg -Continue remdesivir -Airborne and contact precautions -Trend inflammatory markers such as D-dimer, LDH, CRP -Robitussin/Tussionex as needed for cough -Vitamin C and zinc -Closely monitor patient's respiratory status  Resistant hypertension: Chronic Current blood pressure 109/58.  Systolic range over past 24 hours 109-133.  Patient's home medications include amlodipine 10, chlorthalidone 25, metoprolol 25, Avapro 300 and spironolactone 25 mg.  Follows with Dr. Duke Salvia, cardiology. -Continue home amlodipine, chlorthalidone, metoprolol -Hold home irbesartan and spironolactone, can consider restart if needed  Elevated creatinine, improving Patient creatinine of 1.28 on admission, currently 1.04.  Baseline appears to be about 1.0. -Discontinue IV fluids -Continue to monitor  Elevated glucose Current glucose 158.  A1c of 6.5 on 08/2019.  -Monitor CBG with daily BMPs  OSA: Chronic Appears to be noncompliant with CPAP at home.  Unable to use CPAP during this hospitalization due to COVID-19 status.   Last night had high flow nasal cannula bumped to 15 L since he was unable to use CPAP. -Continue to monitor respiratory status  FEN/GI: Heart healthy PPx: Lovenox 40  Disposition: Admitted to cardiac telemetry  Subjective:  On speaking with patient today he states that in the event his care needs to be escalated he would like to change his CODE STATUS to allow for intubation.  He still would not want CPR or defibrillation.  Patient not appearing in respiratory distress on my exam, patient not a candidate for CPAP overnight due to COVID-19 status so HFNC was turned up to 15.  Patient states he did pretty well with this.  Currently satting in the low to mid 90s on 10 L HFNC.  Patient without complaints right now other than cough and some chills overnight.  Denies abdominal pains, diarrhea, or vomiting.  Objective: Temp:  [97.6 F (36.4 C)-99.8 F (37.7 C)] 99.7 F (37.6 C) (01/24 0400) Pulse Rate:  [60-88] 77 (01/24 0800) Resp:  [20-35] 22 (01/24 0800) BP: (89-133)/(58-104) 119/104 (01/24 0800) SpO2:  [87 %-96 %] 94 % (01/24 0800) Weight:  [111.1 kg] 111.1 kg (01/23 2122) Physical Exam: General: Alert and oriented in no apparent distress Heart: Regular rate and rhythm with no murmurs appreciated Lungs: Fine crackles bilaterally with some expiratory wheezing noted.  No respiratory distress on my exam this morning. Abdomen: Bowel sounds present Skin: Warm and dry Extremities: No lower extremity edema  Laboratory: Recent Labs  Lab 01/22/20 1255  WBC 6.6  HGB 12.9*  HCT 41.7  PLT 155   Recent Labs  Lab 01/22/20 1255 01/23/20 0345  NA 136 137  K 3.8 4.2  CL 98 100  CO2 25 27  BUN 19 21*  CREATININE 1.28*  1.04  CALCIUM 8.2* 8.3*  PROT 6.8  --   BILITOT 0.6  --   ALKPHOS 45  --   ALT 40  --   AST 40  --   GLUCOSE 126* 158*    Imaging/Diagnostic Tests: DG Chest Port 1 View  Result Date: 01/22/2020 CLINICAL DATA:  39 year old male with shortness of breath. COVID  diagnosis positive 01/18/2020 EXAM: PORTABLE CHEST 1 VIEW COMPARISON:  04/23/2016 FINDINGS: Cardiomediastinal silhouette unchanged in size and contour. Low lung volumes with reticular opacities bilaterally. No pneumothorax or large pleural effusion. IMPRESSION: Low lung volumes with reticulonodular opacities compatible with multifocal infection and the given history Electronically Signed   By: Corrie Mckusick D.O.   On: 01/22/2020 12:29     Lurline Del, DO 01/23/2020, 10:02 AM PGY-1, [place] Intern pager: [phone], text pages welcome

## 2020-01-23 NOTE — Plan of Care (Signed)
Care plan discussed with pt

## 2020-01-24 LAB — CBC
HCT: 44 % (ref 39.0–52.0)
Hemoglobin: 13.9 g/dL (ref 13.0–17.0)
MCH: 25.6 pg — ABNORMAL LOW (ref 26.0–34.0)
MCHC: 31.6 g/dL (ref 30.0–36.0)
MCV: 81.2 fL (ref 80.0–100.0)
Platelets: 212 10*3/uL (ref 150–400)
RBC: 5.42 MIL/uL (ref 4.22–5.81)
RDW: 14.7 % (ref 11.5–15.5)
WBC: 11.5 10*3/uL — ABNORMAL HIGH (ref 4.0–10.5)
nRBC: 0 % (ref 0.0–0.2)

## 2020-01-24 LAB — COMPREHENSIVE METABOLIC PANEL
ALT: 47 U/L — ABNORMAL HIGH (ref 0–44)
AST: 37 U/L (ref 15–41)
Albumin: 3 g/dL — ABNORMAL LOW (ref 3.5–5.0)
Alkaline Phosphatase: 51 U/L (ref 38–126)
Anion gap: 12 (ref 5–15)
BUN: 29 mg/dL — ABNORMAL HIGH (ref 6–20)
CO2: 26 mmol/L (ref 22–32)
Calcium: 8.6 mg/dL — ABNORMAL LOW (ref 8.9–10.3)
Chloride: 100 mmol/L (ref 98–111)
Creatinine, Ser: 1.17 mg/dL (ref 0.61–1.24)
GFR calc Af Amer: >60 mL/min (ref >60)
GFR calc non Af Amer: >60 mL/min (ref >60)
Glucose, Bld: 155 mg/dL — ABNORMAL HIGH (ref 70–99)
Potassium: 4.8 mmol/L (ref 3.5–5.1)
Sodium: 138 mmol/L (ref 135–145)
Total Bilirubin: 0.6 mg/dL (ref 0.3–1.2)
Total Protein: 6.8 g/dL (ref 6.5–8.1)

## 2020-01-24 LAB — HEPATITIS B SURFACE ANTIBODY, QUANTITATIVE: Hep B S AB Quant (Post): <3.1 m[IU]/mL — ABNORMAL LOW (ref >9.9)

## 2020-01-24 MED ORDER — DEXAMETHASONE 6 MG PO TABS
6.0000 mg | ORAL_TABLET | Freq: Every day | ORAL | Status: DC
  Administered 2020-01-25 – 2020-01-26 (×2): 6 mg via ORAL
  Filled 2020-01-24 (×2): qty 1

## 2020-01-24 MED ORDER — DEXAMETHASONE 6 MG PO TABS: 6.0000 mg | ORAL_TABLET | Freq: Every day | ORAL | Status: DC

## 2020-01-24 NOTE — Progress Notes (Signed)
Pt is alert, oriented and capable of calling his family or significant other as he desires.  Pt has been on the phone several times when I have been in his room today.

## 2020-01-24 NOTE — Progress Notes (Signed)
  RT Note:  Unable to use CPAP at this time due to COVID.

## 2020-01-24 NOTE — Progress Notes (Addendum)
Family Medicine Teaching Service Daily Progress Note Intern Pager: [phone]  Patient name: Austin [name] Medical record number: [id number] Date of birth: 09-29-1981 Age: 39 y.o. Gender: male  Primary Care Provider: Glendale Chard, MD Consultants: None Code Status: Partial - No CPR/Defib, okay to intubate  Pt Overview and Major Events to Date:  1/23-admitted  Assessment and Plan: Austin [name] is a 39 y.o. male presenting with acute hypoxic respiratory failure secondary to Covid pneumonia. PMH is significant for hypertension, elevated BMI, OSA.   Acute hypoxic respiratory failure secondary to Covid pneumonia Patient positive for Covid as of 01/17/2020.  Patient had received first dose of Covid vaccine earlier this month.  In the ED had desats into the high 80s on room air with elevated inflammatory markers.  Day 3/10 of Decadron, day 3/5 of remdesivir. -Continue Decadron over 24-hour 6 mg -Continue remdesivir -Airborne and contact precautions -Trend inflammatory markers such as D-dimer, LDH, CRP -Robitussin/Tussionex as needed for cough -Vitamin C and zinc -Closely monitor patient's respiratory status  Resistant hypertension: Chronic Current blood pressure 105/75.  Systolic range over past 24 hours 105-121.  Patient's home medications include amlodipine 10, chlorthalidone 25, metoprolol 25, Avapro 300 and spironolactone 25 mg.  Follows with Dr. Oval Linsey, cardiology. -Continue home amlodipine, chlorthalidone, metoprolol -Hold home irbesartan and spironolactone, can consider restart if needed  Elevated creatinine, improving Patient creatinine of 1.28 on admission, currently 1.17.  Baseline appears to be about 1.0. -Discontinue IV fluids -Continue to monitor  Elevated glucose CBG range over past 24 hours 126-158.  A1c of 6.5 on 08/2019.  -Monitor CBG with daily BMPs  OSA: Chronic Appears to be noncompliant with CPAP at home. Last night had high flow nasal cannula at 15 L and  seems unable to use CPAP in the setting of COVID-19.  Patient dorsals he did well with this. -Continue to monitor respiratory status  FEN/GI: Heart healthy PPx: Lovenox 40  Disposition: Admitted to cardiac telemetry  Subjective:  Patient states he still does get short of breath when standing and moving around but overall does feel his breathing status is improved somewhat.  Currently on 7 L high flow nasal cannula overnight as he is unable to use BiPAP in the setting of COVID-19.    Objective: Temp:  [98.1 F (36.7 C)-99.1 F (37.3 C)] 98.7 F (37.1 C) (01/25 0454) Pulse Rate:  [60-83] 74 (01/25 0500) Resp:  [19-27] 22 (01/25 0500) BP: (104-130)/(55-84) 105/75 (01/25 0454) SpO2:  [92 %-94 %] 94 % (01/25 0500) Physical Exam: General: Alert and oriented in no apparent distress Heart: Regular rate and rhythm with no murmurs appreciated Lungs: Fine crackles bilaterally but no wheezing noted today, seems improved from yesterday. Abdomen: Bowel sounds present Skin: Warm and dry Extremities: No lower extremity edema   Laboratory: Recent Labs  Lab 01/22/20 1255 01/24/20 0639  WBC 6.6 11.5*  HGB 12.9* 13.9  HCT 41.7 44.0  PLT 155 212   Recent Labs  Lab 01/22/20 1255 01/23/20 0345 01/24/20 0639  NA 136 137 138  K 3.8 4.2 4.8  CL 98 100 100  CO2 25 27 26   BUN 19 21* 29*  CREATININE 1.28* 1.04 1.17  CALCIUM 8.2* 8.3* 8.6*  PROT 6.8  --  6.8  BILITOT 0.6  --  0.6  ALKPHOS 45  --  51  ALT 40  --  47*  AST 40  --  37  GLUCOSE 126* 158* 155*    Imaging/Diagnostic Tests: DG Chest [facility] 1 [address]  Result Date: 01/22/2020 CLINICAL DATA:  39 year old male with shortness of breath. COVID diagnosis positive 01/18/2020 EXAM: PORTABLE CHEST 1 VIEW COMPARISON:  04/23/2016 FINDINGS: Cardiomediastinal silhouette unchanged in size and contour. Low lung volumes with reticular opacities bilaterally. No pneumothorax or large pleural effusion. IMPRESSION: Low lung volumes with  reticulonodular opacities compatible with multifocal infection and the given history Electronically Signed   By: Gilmer Mor D.O.   On: 01/22/2020 12:29     Jackelyn Poling, DO 01/24/2020, 8:45 AM PGY-1, [facility] FPTS Intern pager: [phone], text pages welcome

## 2020-01-24 NOTE — Progress Notes (Signed)
Rt Note:  Unable to use CPAP at this time due to COVID status.

## 2020-01-25 LAB — BASIC METABOLIC PANEL
Anion gap: 12 (ref 5–15)
BUN: 30 mg/dL — ABNORMAL HIGH (ref 6–20)
CO2: 27 mmol/L (ref 22–32)
Calcium: 8.6 mg/dL — ABNORMAL LOW (ref 8.9–10.3)
Chloride: 99 mmol/L (ref 98–111)
Creatinine, Ser: 1.14 mg/dL (ref 0.61–1.24)
GFR calc Af Amer: >60 mL/min (ref >60)
GFR calc non Af Amer: >60 mL/min (ref >60)
Glucose, Bld: 189 mg/dL — ABNORMAL HIGH (ref 70–99)
Potassium: 4.3 mmol/L (ref 3.5–5.1)
Sodium: 138 mmol/L (ref 135–145)

## 2020-01-25 LAB — CBC
HCT: 45.2 % (ref 39.0–52.0)
Hemoglobin: 14.1 g/dL (ref 13.0–17.0)
MCH: 25.3 pg — ABNORMAL LOW (ref 26.0–34.0)
MCHC: 31.2 g/dL (ref 30.0–36.0)
MCV: 81 fL (ref 80.0–100.0)
Platelets: 263 10*3/uL (ref 150–400)
RBC: 5.58 MIL/uL (ref 4.22–5.81)
RDW: 14.6 % (ref 11.5–15.5)
WBC: 11 10*3/uL — ABNORMAL HIGH (ref 4.0–10.5)
nRBC: 0 % (ref 0.0–0.2)

## 2020-01-25 NOTE — Plan of Care (Signed)
  Problem: Education: Goal: Knowledge of General Education information will improve Description Including pain rating scale, medication(s)/side effects and non-pharmacologic comfort measures Outcome: Progressing   Problem: Health Behavior/Discharge Planning: Goal: Ability to manage health-related needs will improve Outcome: Progressing   

## 2020-01-25 NOTE — Progress Notes (Signed)
RT Note:  Unable to place CPAP at this time due to COVID restrictions.

## 2020-01-25 NOTE — Progress Notes (Signed)
Family Medicine Teaching Service Daily Progress Note Intern Pager: [phone]  Patient name: KYRIE FLUDD Medical record number: [id number] Date of birth: 02/08/1981 Age: 39 y.o. Gender: male  Primary Care Provider: Glendale Chard, MD Consultants: None Code Status: Partial - No CPR/Defib, okay to intubate  Pt Overview and Major Events to Date:  1/23-admitted  Assessment and Plan: Tor D Bashore is a 39 y.o. male presenting with acute hypoxic respiratory failure secondary to Covid pneumonia. PMH is significant for hypertension, elevated BMI, OSA.   Acute hypoxic respiratory failure secondary to Covid pneumonia Patient positive for Covid as of 01/17/2020.  Patient had received first dose of Covid vaccine earlier this month.  Day 4/10 of Decadron, day 4/5 of remdesivir. -Continue Decadron over 24-hour 6 mg -Continue remdesivir -Airborne and contact precautions -Trend inflammatory markers such as D-dimer, LDH, CRP -Robitussin/Tussionex as needed for cough -Vitamin C and zinc -Closely monitor patient's respiratory status  Resistant hypertension: Chronic Current blood pressure 134/83.  Systolic range over past 24 hours 105-134.  Patient's home medications include amlodipine 10, chlorthalidone 25, metoprolol 25, Avapro 300 and spironolactone 25 mg.  Follows with Dr. Oval Linsey, cardiology. -Continue home amlodipine, chlorthalidone, metoprolol -Hold home irbesartan and spironolactone, can consider restart if needed  Elevated creatinine, improving Patient creatinine of 1.28 on admission, currently 1.14.  Baseline appears to be about 1.0. -Discontinue IV fluids -Continue to monitor  Elevated glucose CBG range over past 24 hours 155.  A1c of 6.5 on 08/2019.  -Monitor CBG with daily BMPs  OSA: Chronic Appears to be noncompliant with CPAP at home.  Patient on 3 L nasal cannula overnight and did have some desats into the upper 80s.  Unable to use CPAP in the setting of COVID-19. -Continue to  monitor respiratory status  FEN/GI: Heart healthy PPx: Lovenox 40  Disposition: Admitted to cardiac telemetry  Subjective:  Patient states he feels much better today. He did have some desats last night but states that these were when he woke up without his nasal cannula in place. Currently satting mid 90s on 3L.  Objective: Temp:  [98 F (36.7 C)-99 F (37.2 C)] 98.5 F (36.9 C) (01/26 0625) Pulse Rate:  [57-84] 62 (01/26 0625) Resp:  [16-27] 21 (01/26 0625) BP: (126-134)/(75-87) 134/83 (01/26 0625) SpO2:  [86 %-95 %] 86 % (01/26 9379) Physical Exam: General: Alert and oriented in no apparent distress Heart: Regular rate and rhythm with no murmurs appreciated Lungs: CTA bilaterally Extremities: No lower extremity edema  Laboratory: Recent Labs  Lab 01/22/20 1255 01/24/20 0639 01/25/20 0719  WBC 6.6 11.5* 11.0*  HGB 12.9* 13.9 14.1  HCT 41.7 44.0 45.2  PLT 155 212 263   Recent Labs  Lab 01/22/20 1255 01/22/20 1255 01/23/20 0345 01/24/20 0639 01/25/20 0719  NA 136   < > 137 138 138  K 3.8   < > 4.2 4.8 4.3  CL 98   < > 100 100 99  CO2 25   < > 27 26 27   BUN 19   < > 21* 29* 30*  CREATININE 1.28*   < > 1.04 1.17 1.14  CALCIUM 8.2*   < > 8.3* 8.6* 8.6*  PROT 6.8  --   --  6.8  --   BILITOT 0.6  --   --  0.6  --   ALKPHOS 45  --   --  51  --   ALT 40  --   --  47*  --   AST 40  --   --  37  --   GLUCOSE 126*   < > 158* 155* 189*   < > = values in this interval not displayed.    Imaging/Diagnostic Tests: No results found.   Jackelyn Poling, DO 01/25/2020, 9:27 AM PGY-1, [facility] FPTS Intern pager: [phone], text pages welcome

## 2020-01-26 LAB — QUANTIFERON-TB GOLD PLUS (RQFGPL)
QuantiFERON Mitogen Value: 0.35 IU/mL
QuantiFERON Nil Value: 0.05 IU/mL
QuantiFERON TB1 Ag Value: 0.04 IU/mL
QuantiFERON TB2 Ag Value: 0.04 IU/mL

## 2020-01-26 LAB — QUANTIFERON-TB GOLD PLUS: QuantiFERON-TB Gold Plus: UNDETERMINED — AB

## 2020-01-26 MED ORDER — DEXAMETHASONE 6 MG PO TABS: 6.0000 mg | ORAL_TABLET | Freq: Every day | ORAL | 0 refills | Status: DC

## 2020-01-26 NOTE — TOC Transition Note (Signed)
Transition of Care [facility]) - CM/SW Discharge Note   Patient Details  Name: Austin [name] MRN: [id number] Date of Birth: 29-Aug-1981  [facility] CM/SW Contact:  Cherylann Parr, RN Phone Number: 01/26/2020, 4:03 PM   Clinical Narrative:  Pt to discharge home today - pt plans to drive himself home.  Pt informed CM that he has a PCP and denied barriers with paying for discharge medications.  Pt in agreement to discharge home with oxygen - choice given - pt chose [facility] - agency accepts referral.  [facility] confirms pt will receive portable oxygen concentrator prior to discharge and therefore he can leave [facility] facility prior to home equipment being set up.  CM communicated this to pt and that [employer] would contact him via phone to set up home oxygen delivery equipment.     Final next level of care: Home/Self Care Barriers to Discharge: Barriers Resolved   Patient Goals and CMS Choice Patient states their goals for this hospitalization and ongoing recovery are:: Pt did not specifiy a goal CMS Medicare.gov Compare Post Acute Care list provided to:: Patient Choice offered to / list presented to : Patient  Discharge Placement                       Discharge Plan and Services                DME Arranged: Oxygen DME Agency: [facility] Date DME Agency Contacted: 01/26/20 Time DME Agency Contacted: [phone] Representative spoke with at DME Agency: [email or web address]            Social Determinants of Health (SDOH) Interventions     Readmission Risk Interventions No flowsheet data found.

## 2020-01-26 NOTE — Progress Notes (Signed)
Family Medicine Teaching Service Daily Progress Note Intern Pager: [phone]  Patient name: Austin [name] Medical record number: [id number] Date of birth: [birth year] Age: 39 y.o. Gender: male  Primary Care Provider: Dorothyann Peng, MD Consultants: None Code Status: Partial - No CPR/Defib, okay to intubate  Pt Overview and Major Events to Date:  1/23-admitted  Assessment and Plan: Austin [name] is a 39 y.o. male presenting with acute hypoxic respiratory failure secondary to Covid pneumonia. PMH is significant for hypertension, elevated BMI, OSA.   Acute hypoxic respiratory failure secondary to Covid pneumonia Patient positive for Covid as of 01/17/2020.  Patient had received first dose of Covid vaccine earlier this month.  Day 5/10 of Decadron, day 5/5 of remdesivir. -Continue Decadron -Airborne and contact precautions -Trend inflammatory markers such as D-dimer, LDH, CRP -Robitussin/Tussionex as needed for cough -Vitamin C and zinc -Closely monitor patient's respiratory status  Resistant hypertension: Chronic Current blood pressure 116/92.  Systolic range over past 24 hours 113-128.  Patient's home medications include amlodipine 10, chlorthalidone 25, metoprolol 25, Avapro 300 and spironolactone 25 mg.  Follows with Dr. Duke Salvia, cardiology. -Continue home amlodipine, chlorthalidone, metoprolol -Hold home irbesartan and spironolactone, can consider restart if needed  Elevated creatinine, improving Patient creatinine of 1.28 on admission, most recently 1.14.  Baseline appears to be about 1.0. -Discontinue IV fluids -Continue to monitor  Elevated glucose CBG range over past 24 hours 155-189.  A1c of 6.5 on 08/2019.  -Monitor CBG with daily BMPs  OSA: Chronic Appears to be noncompliant with CPAP at home.  Patient on 2 L nasal cannula overnight and did have some desats into the upper 80s.  Unable to use CPAP in the setting of COVID-19. -Continue to monitor respiratory  status  FEN/GI: Heart healthy PPx: Lovenox 40  Disposition: Admitted to cardiac telemetry  Subjective:  Without complaints this morning.  Satting well on 2 L nasal cannula.  Patient states that he is anticipating going home later today. Objective: Temp:  [98.1 F (36.7 C)-99.2 F (37.3 C)] 98.1 F (36.7 C) (01/27 0508) Pulse Rate:  [62-73] 66 (01/27 0508) Resp:  [16-21] 19 (01/27 0508) BP: (113-134)/(73-92) 116/92 (01/27 0508) SpO2:  [86 %-94 %] 94 % (01/27 0508) Physical Exam: General: Alert and oriented in no apparent distress Heart: RRR with no murmurs appreciated Lungs: CTA bilaterally Abdomen: Bowel sounds present, no abdominal pain Skin: Warm and dry Extremities: No lower extremity edema   Laboratory: Recent Labs  Lab 01/22/20 1255 01/24/20 0639 01/25/20 0719  WBC 6.6 11.5* 11.0*  HGB 12.9* 13.9 14.1  HCT 41.7 44.0 45.2  PLT 155 212 263   Recent Labs  Lab 01/22/20 1255 01/22/20 1255 01/23/20 0345 01/24/20 0639 01/25/20 0719  NA 136   < > 137 138 138  K 3.8   < > 4.2 4.8 4.3  CL 98   < > 100 100 99  CO2 25   < > 27 26 27   BUN 19   < > 21* 29* 30*  CREATININE 1.28*   < > 1.04 1.17 1.14  CALCIUM 8.2*   < > 8.3* 8.6* 8.6*  PROT 6.8  --   --  6.8  --   BILITOT 0.6  --   --  0.6  --   ALKPHOS 45  --   --  51  --   ALT 40  --   --  47*  --   AST 40  --   --  37  --  GLUCOSE 126*   < > 158* 155* 189*   < > = values in this interval not displayed.    Imaging/Diagnostic Tests: No results found.   Lurline Del, DO 01/26/2020, 6:21 AM PGY-1, [facility] Intern pager: [phone], text pages welcome

## 2020-01-26 NOTE — Progress Notes (Signed)
Pt given D/C instructions and all questions answered. No printed prescriptions to give home O2 delivered to the room prior to D/C. IV removed. Pt taken to car with all belongings.

## 2020-01-26 NOTE — Progress Notes (Signed)
SATURATION QUALIFICATIONS:  Patient Saturations on Room Air at Rest = 93%  Patient Saturations on Room Air while Ambulating = lowest 88%   Patient Saturations on O2 .5L unchanged (88%), 1L unchanged 2L increased 93%   Pt return to rest 97% on RA   Please briefly explain why patient needs home oxygen: Pt O2 sat decreased to upper 80's while ambulating. Pt O2 sat steady at 90% until Pt began to walk more briskly, then O2 sat decreased to 88% and maintained there until 2L of O2 were administered.

## 2020-01-26 NOTE — Progress Notes (Signed)
Physician called to clarify if patient able to tolerate room air.  Patient has been on 2L/[place] all night, with sats 90-93%.

## 2020-01-26 NOTE — Discharge Instructions (Signed)
Thank you so much for allowing [place] to be part of your care.  You were admitted to [facility] due to your COVID-19 infection/pneumonia causing you to require oxygen.  While you are here you received IV remdesivir, steroids, and oxygen as needed.  Fortunately, your breathing status greatly improved while you have been here, however you are still requiring oxygen.  We are hopeful as you continue to improve from this infection that your oxygen requirement will decrease and hopefully go back to your baseline.  Per your report, initial symptom onset for you was 1/16, due to your required hospitalization we recommend a 21-day isolation which would put you to complete this on 2/6.  Please make sure you are wearing a mask at all times and following appropriate hand hygiene if you need to be within close proximity of anyone.   In terms of your blood pressure, we have continued your home metoprolol but held the remainder of your medications.  Your blood pressure has looked good while you are here, we recommend following up with your primary care provider in the next several days to discuss adding back on medications.  If you experience any chest pain, worsening shortness of breath, confusion, new onset weakness/numbness please make sure seek medical care/ED ASAP.

## 2020-01-27 LAB — CULTURE, BLOOD (ROUTINE X 2)
Culture: NO GROWTH
Culture: NO GROWTH

## 2020-01-30 DIAGNOSIS — G4733 Obstructive sleep apnea (adult) (pediatric): Secondary | ICD-10-CM | POA: Diagnosis not present

## 2020-01-31 ENCOUNTER — Telehealth: Payer: Self-pay

## 2020-01-31 NOTE — Telephone Encounter (Signed)
Transition Care Management Follow-up Telephone Call  Date of discharge and from where: 01/26/2020  How have you been since you were released from the hospital? Good  Any questions or concerns? no  Items Reviewed:  Did the pt receive and understand the discharge instructions provided? Yes   Medications obtained and verified?yes   Any new allergies since your discharge? no  Dietary orders reviewed?no  Do you have support at home?yes   Other (ie: DME, Home Health, etc) no  Functional Questionnaire: (I = Independent and D = Dependent) ADL's: i  Bathing/Dressing- i  Meal Prep- i  Eating- i  Maintaining continence- i  Transferring/Ambulation-i  Managing Meds- i   Follow up appointments reviewed:    PCP Hospital f/u appt confirmed?yes Scheduled to see sanders on 02/09/20  Specialist Hospital f/u appt confirmed?no  Are transportation arrangements needed? No   If their condition worsens, is the pt aware to call  their PCP or go to the ED? yes  Was the patient provided with contact information for the PCP's office or ED?yes   Was the pt encouraged to call back with questions or concerns? Yes

## 2020-02-04 ENCOUNTER — Ambulatory Visit: Payer: BC Managed Care – PPO

## 2020-02-06 ENCOUNTER — Ambulatory Visit: Payer: BC Managed Care – PPO

## 2020-02-09 ENCOUNTER — Telehealth (INDEPENDENT_AMBULATORY_CARE_PROVIDER_SITE_OTHER): Payer: BC Managed Care – PPO | Admitting: Internal Medicine

## 2020-02-09 ENCOUNTER — Other Ambulatory Visit: Payer: Self-pay

## 2020-02-09 ENCOUNTER — Encounter: Payer: Self-pay | Admitting: Internal Medicine

## 2020-02-09 VITALS — HR 92

## 2020-02-09 DIAGNOSIS — R7309 Other abnormal glucose: Secondary | ICD-10-CM | POA: Diagnosis not present

## 2020-02-09 DIAGNOSIS — Z09 Encounter for follow-up examination after completed treatment for conditions other than malignant neoplasm: Secondary | ICD-10-CM

## 2020-02-09 DIAGNOSIS — U071 COVID-19: Secondary | ICD-10-CM | POA: Diagnosis not present

## 2020-02-09 DIAGNOSIS — J9601 Acute respiratory failure with hypoxia: Secondary | ICD-10-CM | POA: Diagnosis not present

## 2020-02-09 DIAGNOSIS — I10 Essential (primary) hypertension: Secondary | ICD-10-CM

## 2020-02-09 NOTE — Patient Instructions (Signed)

## 2020-02-10 ENCOUNTER — Telehealth: Payer: Self-pay

## 2020-02-10 NOTE — Telephone Encounter (Signed)
The pt was notified that his FL[employer] form has been faxed to his employer and a copy has been placed up front for pickup.

## 2020-02-14 ENCOUNTER — Telehealth: Payer: Self-pay | Admitting: *Deleted

## 2020-02-14 NOTE — Telephone Encounter (Addendum)
Attempted to contact pt to schedule COVID vaccine; left message on voicemail with callback number [phone].

## 2020-02-15 ENCOUNTER — Ambulatory Visit: Payer: BC Managed Care – PPO

## 2020-02-15 ENCOUNTER — Other Ambulatory Visit: Payer: Self-pay

## 2020-02-15 ENCOUNTER — Other Ambulatory Visit: Payer: BC Managed Care – PPO

## 2020-02-15 VITALS — BP 126/88 | Temp 98.4°F | Ht 69.0 in | Wt 244.4 lb

## 2020-02-15 DIAGNOSIS — I10 Essential (primary) hypertension: Secondary | ICD-10-CM

## 2020-02-15 DIAGNOSIS — U071 COVID-19: Secondary | ICD-10-CM | POA: Diagnosis not present

## 2020-02-15 DIAGNOSIS — N179 Acute kidney failure, unspecified: Secondary | ICD-10-CM | POA: Diagnosis not present

## 2020-02-15 NOTE — Progress Notes (Signed)
Patient was seen today for a blood pressure check. Blood work was also drawn for follow up for covid

## 2020-02-16 ENCOUNTER — Other Ambulatory Visit: Payer: Self-pay | Admitting: Physician Assistant

## 2020-02-16 LAB — CBC WITH DIFFERENTIAL/PLATELET
Basophils Absolute: 0 10*3/uL (ref 0.0–0.2)
Basos: 1 %
EOS (ABSOLUTE): 0.3 10*3/uL (ref 0.0–0.4)
Eos: 6 %
Hematocrit: 42 % (ref 37.5–51.0)
Hemoglobin: 13.5 g/dL (ref 13.0–17.7)
Immature Grans (Abs): 0 10*3/uL (ref 0.0–0.1)
Immature Granulocytes: 1 %
Lymphocytes Absolute: 1.4 10*3/uL (ref 0.7–3.1)
Lymphs: 24 %
MCH: 25.9 pg — ABNORMAL LOW (ref 26.6–33.0)
MCHC: 32.1 g/dL (ref 31.5–35.7)
MCV: 81 fL (ref 79–97)
Monocytes Absolute: 0.6 10*3/uL (ref 0.1–0.9)
Monocytes: 11 %
Neutrophils Absolute: 3.3 10*3/uL (ref 1.4–7.0)
Neutrophils: 57 %
Platelets: 166 10*3/uL (ref 150–450)
RBC: 5.21 x10E6/uL (ref 4.14–5.80)
RDW: 14.7 % (ref 11.6–15.4)
WBC: 5.7 10*3/uL (ref 3.4–10.8)

## 2020-02-16 LAB — BMP8+EGFR
BUN/Creatinine Ratio: 14 (ref 9–20)
BUN: 13 mg/dL (ref 6–24)
CO2: 21 mmol/L (ref 20–29)
Calcium: 9.5 mg/dL (ref 8.7–10.2)
Chloride: 102 mmol/L (ref 96–106)
Creatinine, Ser: 0.96 mg/dL (ref 0.76–1.27)
GFR calc Af Amer: 114 mL/min/{1.73_m2} (ref >59)
GFR calc non Af Amer: 98 mL/min/{1.73_m2} (ref >59)
Glucose: 129 mg/dL — ABNORMAL HIGH (ref 65–99)
Potassium: 4 mmol/L (ref 3.5–5.2)
Sodium: 141 mmol/L (ref 134–144)

## 2020-02-26 ENCOUNTER — Encounter: Payer: Self-pay | Admitting: Internal Medicine

## 2020-02-26 DIAGNOSIS — U071 COVID-19: Secondary | ICD-10-CM | POA: Diagnosis not present

## 2020-02-26 NOTE — Progress Notes (Signed)
Virtual Visit via Video   This visit type was conducted due to national recommendations for restrictions regarding the COVID-19 Pandemic (e.g. social distancing) in an effort to limit this patient's exposure and mitigate transmission in our community.  Due to his co-morbid illnesses, this patient is at least at moderate risk for complications without adequate follow up.  This format is felt to be most appropriate for this patient at this time.  All issues noted in this document were discussed and addressed.  A limited physical exam was performed with this format.    This visit type was conducted due to national recommendations for restrictions regarding the COVID-19 Pandemic (e.g. social distancing) in an effort to limit this patient's exposure and mitigate transmission in our community.  Patients identity confirmed using two different identifiers.  This format is felt to be most appropriate for this patient at this time.  All issues noted in this document were discussed and addressed.  No physical exam was performed (except for noted visual exam findings with Video Visits).    Date:  02/26/2020   ID:  Austin [name], DOB [birth year], MRN [id number]  Patient Location:  Home  Provider location:   Office    Chief Complaint:  "I need f/u for COVID'  History of Present Illness:    Austin [name] is a 39 y.o. male who presents via video conferencing for a telehealth visit today.    The patient does not have symptoms concerning for COVID-19 infection (fever, chills, cough, or new shortness of breath).   He presents today for virtual visit. He prefers this method of contact due to COVID-19 pandemic.  He is here today for f/u COVID pneumonia.  He reports he was tested for COVID on 1/18. He admits he attended a funeral several days prior to feeling sick around 1/16.  He became lethargic and checked his home pulse ox which was in the 80s, so he went to hospital on the 1/23 for further evaluation.   ER evaluation revealed patient had desaturations to high 80s on room air, tachypneic to 35 with elevated inflammatory markers.  CXR showed low lung volumes with reticulonodular opacities consistent with multifocal infection. Hospital course significant for need for up to 15L HFNC, but was able to wean back to ~2L w/ rest and ambulation by discharge.  He was treated with IV remdesivir (completing a 5-day course during his hospitalization) and Decadron (10-day course) as well as kept on supplemental oxygen. DME order for home oxygen was placed to assist patient in continued recovery from COVID-19. He was discharged on 1/27 in stable condition. He is now with an occasional cough. He is aware that he needed to quarantine until 21 days from original testing date. He states he has two sets of paperwork that need to be completed for his job.     Past Medical History:  Diagnosis Date  . COVID-19   . Essential hypertension 09/18/2017  . Hypertension    Past Surgical History:  Procedure Laterality Date  . KNEE ARTHROSCOPY WITH ANTERIOR CRUCIATE LIGAMENT (ACL) REPAIR       Current Meds  Medication Sig  . albuterol (VENTOLIN HFA) 108 (90 Base) MCG/ACT inhaler Inhale 2 puffs into the lungs every 6 (six) hours as needed for wheezing or shortness of breath.  Marland Kitchen amLODipine (NORVASC) 10 MG tablet Take 1 tablet (10 mg total) by mouth daily.  . benzonatate (TESSALON) 100 MG capsule Take 1-2 capsules (100-200 mg total) by mouth 3 (three)  times daily as needed for cough.  . loratadine (CLARITIN) 10 MG tablet Take 10 mg by mouth daily.  Marland Kitchen MAGNESIUM CITRATE PO Take 500 mg by mouth at bedtime.   . metoprolol succinate (TOPROL-XL) 25 MG 24 hr tablet Take 1 tablet (25 mg total) by mouth at bedtime.  . modafinil (PROVIGIL) 200 MG tablet TAKE 1 TABLET BY MOUTH DAILY (Patient taking differently: Take 200 mg by mouth daily as needed. )     Allergies:   Patient has no known allergies.   Social History   Tobacco Use  .  Smoking status: Never Smoker  . Smokeless tobacco: Never Used  Substance Use Topics  . Alcohol use: Yes    Alcohol/week: 2.0 standard drinks    Types: 2 Standard drinks or equivalent per week  . Drug use: No     Family Hx: The patient's family history includes Diabetes in his mother; Healthy in his brother and sister; Hypertension in his maternal grandmother; Multiple sclerosis in his father; Stroke in his maternal grandmother.  ROS:   Please see the history of present illness.    Review of Systems  Constitutional: Negative.   Respiratory: Positive for cough.   Cardiovascular: Negative.   Gastrointestinal: Negative.   Neurological: Negative.   Psychiatric/Behavioral: Negative.     All other systems reviewed and are negative.   Labs/Other Tests and Data Reviewed:    Recent Labs: 01/24/2020: ALT 47 02/15/2020: BUN 13; Creatinine, Ser 0.96; Hemoglobin 13.5; Platelets 166; Potassium 4.0; Sodium 141   Recent Lipid Panel Lab Results  Component Value Date/Time   CHOL 146 09/23/2019 04:32 PM   TRIG 127 01/22/2020 12:55 PM   HDL 43 09/23/2019 04:32 PM   CHOLHDL 3.4 09/23/2019 04:32 PM   LDLCALC 68 09/23/2019 04:32 PM    Wt Readings from Last 3 Encounters:  02/15/20 244 lb 6.4 oz (110.9 kg)  01/22/20 244 lb 14.9 oz (111.1 kg)  12/29/19 252 lb 12.8 oz (114.7 kg)     Exam:    Vital Signs:  Pulse 92   SpO2 95%     Physical Exam  Constitutional: He is oriented to person, place, and time and well-developed, well-nourished, and in no distress.  HENT:  Head: Normocephalic and atraumatic.  Pulmonary/Chest: Effort normal.  Musculoskeletal:     Cervical back: Normal range of motion.  Neurological: He is alert and oriented to person, place, and time.  Psychiatric: Affect normal.  Nursing note and vitals reviewed.   ASSESSMENT & PLAN:     1. COVID-19  TCM PERFORMED. A MEMBER OF THE CLINICAL TEAM SPOKE WITH THE PATIENT UPON DISCHARGE. DISCHARGE SUMMARY WAS REVIEWED IN FULL  DETAIL DURING THE VISIT. MEDS RECONCILED AND COMPARED TO DISCHARGE MEDS. MEDICATION LIST WAS UPDATED AND REVIEWED WITH THE PATIENT.  ALL QUESTIONS WERE ANSWERED TO THE SATISFACTION OF THE PATIENT. Thankfully, he is doing much better since discharge. He is encouraged to stay as active as possible and to stay well hydrated.    2. Other abnormal glucose  He will rto in 3 months for re-evaluation. He is encouraged to avoid sugary beverages and increase regular exercise.   3. Essential hypertension  Chronic. He has resumed all meds. He agrees to come in next week for a nurse visit. I will check his renal function at that time. Importance of medication compliance was discussed with the patient.      COVID-19 Education: The signs and symptoms of COVID-19 were discussed with the patient and how to seek  care for testing (follow up with PCP or arrange E-visit).  The importance of social distancing was discussed today.  Patient Risk:   After full review of this patients clinical status, I feel that they are at least moderate risk at this time.    Medication Adjustments/Labs and Tests Ordered: Current medicines are reviewed at length with the patient today.  Concerns regarding medicines are outlined above.   Tests Ordered: No orders of the defined types were placed in this encounter.   Medication Changes: No orders of the defined types were placed in this encounter.   Disposition:  Follow up in 4 month(s)  Signed, Gwynneth Aliment, MD

## 2020-02-28 ENCOUNTER — Telehealth: Payer: Self-pay

## 2020-02-28 NOTE — Telephone Encounter (Signed)
The pt was told that Dr. Allyne Gee said that she needed the copy of the pt's covid results to send with the pt's [facility] paperwork.

## 2020-03-05 ENCOUNTER — Ambulatory Visit: Payer: BC Managed Care – PPO | Attending: Internal Medicine

## 2020-03-05 DIAGNOSIS — Z23 Encounter for immunization: Secondary | ICD-10-CM

## 2020-03-05 NOTE — Progress Notes (Signed)
   Covid-19 Vaccination Clinic  Name:  KUTTER SCHNEPF    MRN: [id number] DOB: 07-Oct-1981  03/05/2020  Mr. Rumple was observed post Covid-19 immunization for 15 minutes without incident. He was provided with Vaccine Information Sheet and instruction to access the V-Safe system.   Mr. Ord was instructed to call 911 with any severe reactions post vaccine: Marland Kitchen Difficulty breathing  . Swelling of face and throat  . A fast heartbeat  . A bad rash all over body  . Dizziness and weakness   Immunizations Administered    Name Date Dose VIS Date Route   Pfizer COVID-19 Vaccine 03/05/2020  8:21 AM 0.3 mL 12/10/2019 Intramuscular   Manufacturer: [employer], [employer]   Lot: [id number]   NDC: [address]-3

## 2020-03-08 ENCOUNTER — Encounter: Payer: Self-pay | Admitting: Internal Medicine

## 2020-03-08 ENCOUNTER — Telehealth: Payer: Self-pay

## 2020-03-08 NOTE — Telephone Encounter (Signed)
The pt was asked about the copy of his covid results that was needed to send with his disability form and the pt said that he sent it in MyChart.  The pt was told that I don't see a recent MyChart message from him.  The pt said that he will drop a copy off of the covid results by the office.

## 2020-03-08 NOTE — Telephone Encounter (Signed)
I left the pt a message that his disability forms has been faxed.  A copy has been kept for the pt's chart and the originals were mailed to the patient.

## 2020-03-23 ENCOUNTER — Ambulatory Visit: Payer: BC Managed Care – PPO | Admitting: Internal Medicine

## 2020-03-25 DIAGNOSIS — U071 COVID-19: Secondary | ICD-10-CM | POA: Diagnosis not present

## 2020-04-06 ENCOUNTER — Other Ambulatory Visit: Payer: Self-pay

## 2020-04-06 ENCOUNTER — Ambulatory Visit: Payer: BC Managed Care – PPO | Admitting: Internal Medicine

## 2020-04-06 ENCOUNTER — Encounter: Payer: Self-pay | Admitting: Internal Medicine

## 2020-04-06 VITALS — BP 128/84 | HR 82 | Temp 98.2°F | Ht 69.0 in | Wt 246.0 lb

## 2020-04-06 DIAGNOSIS — Z23 Encounter for immunization: Secondary | ICD-10-CM

## 2020-04-06 DIAGNOSIS — I10 Essential (primary) hypertension: Secondary | ICD-10-CM

## 2020-04-06 DIAGNOSIS — Z6836 Body mass index (BMI) 36.0-36.9, adult: Secondary | ICD-10-CM

## 2020-04-06 MED ORDER — TETANUS-DIPHTH-ACELL PERTUSSIS 5-2.5-18.5 LF-MCG/0.5 IM SUSP
0.5000 mL | Freq: Once | INTRAMUSCULAR | Status: AC
  Administered 2020-04-06: 0.5 mL via INTRAMUSCULAR

## 2020-04-06 NOTE — Patient Instructions (Signed)

## 2020-04-06 NOTE — Progress Notes (Signed)
This visit occurred during the SARS-CoV-2 public health emergency.  Safety protocols were in place, including screening questions prior to the visit, additional usage of staff PPE, and extensive cleaning of exam room while observing appropriate contact time as indicated for disinfecting solutions.  Subjective:     Patient ID: Austin [name] , male    DOB: 14-Apr-1981 , 39 y.o.   MRN: [id number]   Chief Complaint  Patient presents with  . Hypertension    HPI  He is here today for a BP check. He reports compliance with BP meds. He denies headaches, chest pain and shortness of breath.   Hypertension This is a chronic problem. The current episode started more than 1 year ago. The problem has been gradually improving since onset. The problem is controlled. Pertinent negatives include no blurred vision, chest pain, palpitations or shortness of breath.     Past Medical History:  Diagnosis Date  . COVID-19   . Essential hypertension 09/18/2017  . Hypertension      Family History  Problem Relation Age of Onset  . Diabetes Mother   . Healthy Sister   . Healthy Brother   . Hypertension Maternal Grandmother   . Stroke Maternal Grandmother   . Multiple sclerosis Father      Current Outpatient Medications:  .  albuterol (VENTOLIN HFA) 108 (90 Base) MCG/ACT inhaler, Inhale 2 puffs into the lungs every 6 (six) hours as needed for wheezing or shortness of breath., Disp: 18 g, Rfl: 0 .  amLODipine (NORVASC) 10 MG tablet, Take 1 tablet (10 mg total) by mouth daily., Disp: 90 tablet, Rfl: 1 .  benzonatate (TESSALON) 100 MG capsule, Take 1-2 capsules (100-200 mg total) by mouth 3 (three) times daily as needed for cough., Disp: 40 capsule, Rfl: 0 .  loratadine (CLARITIN) 10 MG tablet, Take 10 mg by mouth daily., Disp: , Rfl:  .  MAGNESIUM CITRATE PO, Take 500 mg by mouth at bedtime. , Disp: , Rfl:  .  metoprolol succinate (TOPROL-XL) 25 MG 24 hr tablet, Take 1 tablet (25 mg total) by mouth at  bedtime., Disp: 90 tablet, Rfl: 1 .  modafinil (PROVIGIL) 200 MG tablet, TAKE 1 TABLET BY MOUTH DAILY (Patient taking differently: Take 200 mg by mouth daily as needed. ), Disp: 30 tablet, Rfl: 5  Current Facility-Administered Medications:  .  Tdap (BOOSTRIX) injection 0.5 mL, 0.5 mL, Intramuscular, Once, Glendale Chard, MD   No Known Allergies   Review of Systems  Constitutional: Negative.   Eyes: Negative for blurred vision.  Respiratory: Negative.  Negative for shortness of breath.   Cardiovascular: Negative.  Negative for chest pain and palpitations.  Gastrointestinal: Negative.   Neurological: Negative.   Psychiatric/Behavioral: Negative.      Today's Vitals   04/06/20 1115  BP: 128/84  Pulse: 82  Temp: 98.2 F (36.8 C)  TempSrc: Oral  SpO2: 94%  Weight: 246 lb (111.6 kg)  Height: 5\' 9"  (1.753 m)   Body mass index is 36.33 kg/m.   Wt Readings from Last 3 Encounters:  04/06/20 246 lb (111.6 kg)  02/15/20 244 lb 6.4 oz (110.9 kg)  01/22/20 244 lb 14.9 oz (111.1 kg)     Objective:  Physical Exam Vitals and nursing note reviewed.  Constitutional:      Appearance: Normal appearance. He is obese.  Cardiovascular:     Rate and Rhythm: Normal rate and regular rhythm.     Heart sounds: Normal heart sounds.  Pulmonary:  Effort: Pulmonary effort is normal.     Breath sounds: Normal breath sounds.  Skin:    General: Skin is warm.  Neurological:     General: No focal deficit present.     Mental Status: He is alert.  Psychiatric:        Mood and Affect: Mood normal.         Assessment And Plan:     1. Essential hypertension, benign  Chronic, well controlled. He will continue with current meds. He is encouraged to avoid adding salt to his foods. Previous BMP results were reviewed. Due to concomitant obesity, I will check insulin level today. I will make further recommendations once his labs are available for review.   - Insulin, random(561)  2.  Immunization due  He is more than four weeks out from last COVID vaccination. He was given Tdap to update his immunization history.   - Tdap (BOOSTRIX) injection 0.5 mL  3. Class 2 severe obesity due to excess calories with serious comorbidity and body mass index (BMI) of 36.0 to 36.9 in adult [facility])  He is encouraged to strive for BMI less than 30 to decrease cardiac risk. He is advised to exercise at least 150 minutes per week. We discussed various pharmacologic agents which can help to address obesity. Will discuss further at next visit.    Gwynneth Aliment, MD    THE PATIENT IS ENCOURAGED TO PRACTICE SOCIAL DISTANCING DUE TO THE COVID-19 PANDEMIC.

## 2020-04-07 LAB — INSULIN, RANDOM: INSULIN: 95.6 u[IU]/mL — ABNORMAL HIGH (ref 2.6–24.9)

## 2020-04-16 ENCOUNTER — Other Ambulatory Visit: Payer: Self-pay | Admitting: Internal Medicine

## 2020-04-18 ENCOUNTER — Encounter: Payer: Self-pay | Admitting: Adult Health

## 2020-04-18 ENCOUNTER — Other Ambulatory Visit: Payer: Self-pay

## 2020-04-18 ENCOUNTER — Ambulatory Visit: Payer: BC Managed Care – PPO | Admitting: Adult Health

## 2020-04-18 VITALS — BP 134/76 | HR 84 | Ht 69.0 in | Wt 251.0 lb

## 2020-04-18 DIAGNOSIS — G4733 Obstructive sleep apnea (adult) (pediatric): Secondary | ICD-10-CM | POA: Diagnosis not present

## 2020-04-18 DIAGNOSIS — Z9989 Dependence on other enabling machines and devices: Secondary | ICD-10-CM

## 2020-04-18 NOTE — Progress Notes (Signed)
PATIENT: Austin [name] DOB: 1981/05/24  REASON FOR VISIT: follow up HISTORY FROM: patient  HISTORY OF PRESENT ILLNESS: Today 04/18/20:  Austin [name] is an 39 year old male with a history of OSA on cpap. His download indicates that he used his machine 29/30 for a compliance of 97%. He used his machine >4 hours 13/30 for a compliance of 43%. On average he used his machine 4 hours 16 minutes. His residual AHI is 0.6 on 9 cm H20 with EPR 3.  His leak in the 95th percentile is 14.6 L/min.  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  For this reason some nights he has a [name] time using it greater than 4 hours  HISTORY  04/15/19 OSA patient Austin [name], meanwhile 39 years old, seen on 04-15-2019 with CPAP compliance problems.  CD- Currently using pillows, nasal, and losing these at night. He sleeps in ll positions.  He is still a shift worker, which reduces his sleep time to about 5 hours in daytime.  He has any way of restricted number of hours available to sleep and he states that he wakes up and has lost a mask in the process usually after about 3 hours of sleep and this is reflected in his download data.  The compliance report that we provided through [facility] shows on 12 April 2019 97% compliance by days but only 67% compliance by time.  Average use at time is 4 hours 46 minutes, his CPAP is not AutoSet set at 9 cm water pressure with a residual AHI of only 0.6, which speaks for a good resolution.  However he uses nasal pillows and these seem to allow some air leakage when they get displaced.  REVIEW OF SYSTEMS: Out of a complete 14 system review of symptoms, the patient complains only of the following symptoms, and all other reviewed systems are negative.  See HPI  ALLERGIES: No Known Allergies  HOME MEDICATIONS: Outpatient Medications Prior to Visit  Medication Sig Dispense Refill  . albuterol (VENTOLIN HFA) 108 (90 Base) MCG/ACT inhaler Inhale 2 puffs into the lungs every 6 (six) hours as  needed for wheezing or shortness of breath. 18 g 0  . amLODipine (NORVASC) 10 MG tablet TAKE 1 TABLET(10 MG) BY MOUTH DAILY 90 tablet 1  . benzonatate (TESSALON) 100 MG capsule Take 1-2 capsules (100-200 mg total) by mouth 3 (three) times daily as needed for cough. 40 capsule 0  . loratadine (CLARITIN) 10 MG tablet Take 10 mg by mouth daily.    Marland Kitchen MAGNESIUM CITRATE PO Take 500 mg by mouth at bedtime.     . metoprolol succinate (TOPROL-XL) 25 MG 24 hr tablet TAKE 1 TABLET(25 MG) BY MOUTH AT BEDTIME 90 tablet 1  . modafinil (PROVIGIL) 200 MG tablet TAKE 1 TABLET BY MOUTH DAILY (Patient taking differently: Take 200 mg by mouth daily as needed. ) 30 tablet 5   No facility-administered medications prior to visit.    PAST MEDICAL HISTORY: Past Medical History:  Diagnosis Date  . COVID-19   . Essential hypertension 09/18/2017  . Hypertension     PAST SURGICAL HISTORY: Past Surgical History:  Procedure Laterality Date  . KNEE ARTHROSCOPY WITH ANTERIOR CRUCIATE LIGAMENT (ACL) REPAIR      FAMILY HISTORY: Family History  Problem Relation Age of Onset  . Diabetes Mother   . Healthy Sister   . Healthy Brother   . Hypertension Maternal Grandmother   . Stroke Maternal Grandmother   . Multiple  sclerosis Father     SOCIAL HISTORY: Social History   Socioeconomic History  . Marital status: Married    Spouse name: Not on file  . Number of children: Not on file  . Years of education: Not on file  . Highest education level: Not on file  Occupational History  . Not on file  Tobacco Use  . Smoking status: Never Smoker  . Smokeless tobacco: Never Used  Substance and Sexual Activity  . Alcohol use: Yes    Alcohol/week: 2.0 standard drinks    Types: 2 Standard drinks or equivalent per week  . Drug use: No  . Sexual activity: Not on file  Other Topics Concern  . Not on file  Social History Narrative  . Not on file   Social Determinants of Health   Financial Resource Strain:   .  Difficulty of Paying Living Expenses:   Food Insecurity:   . Worried About Programme researcher, broadcasting/film/video in the Last Year:   . Barista in the Last Year:   Transportation Needs:   . Freight forwarder (Medical):   Marland Kitchen Lack of Transportation (Non-Medical):   Physical Activity:   . Days of Exercise per Week:   . Minutes of Exercise per Session:   Stress:   . Feeling of Stress :   Social Connections:   . Frequency of Communication with Friends and Family:   . Frequency of Social Gatherings with Friends and Family:   . Attends Religious Services:   . Active Member of Clubs or Organizations:   . Attends Banker Meetings:   Marland Kitchen Marital Status:   Intimate Partner Violence:   . Fear of Current or Ex-Partner:   . Emotionally Abused:   Marland Kitchen Physically Abused:   . Sexually Abused:       PHYSICAL EXAM  Vitals:   04/18/20 0851  BP: 134/76  Pulse: 84  Weight: 251 lb (113.9 kg)  Height: 5\' 9"  (1.753 m)   Body mass index is 37.07 kg/m.  Generalized: Well developed, in no acute distress  Chest: Lungs clear to auscultation bilaterally  Neurological examination  Mentation: Alert oriented to time, place, history taking. Follows all commands speech and language fluent Cranial nerve II-XII: Extraocular movements were full, visual field were full on confrontational test Head turning and shoulder shrug  were normal and symmetric. Motor: The motor testing reveals 5 over 5 strength of all 4 extremities. Good symmetric motor tone is noted throughout.  Sensory: Sensory testing is intact to soft touch on all 4 extremities. No evidence of extinction is noted.  Gait and station: Gait is normal.    DIAGNOSTIC DATA (LABS, IMAGING, TESTING) - I reviewed patient records, labs, notes, testing and imaging myself where available.  Lab Results  Component Value Date   WBC 5.7 02/15/2020   HGB 13.5 02/15/2020   HCT 42.0 02/15/2020   MCV 81 02/15/2020   PLT 166 02/15/2020      Component  Value Date/Time   NA 141 02/15/2020 1435   K 4.0 02/15/2020 1435   CL 102 02/15/2020 1435   CO2 21 02/15/2020 1435   GLUCOSE 129 (H) 02/15/2020 1435   GLUCOSE 189 (H) 01/25/2020 0719   BUN 13 02/15/2020 1435   CREATININE 0.96 02/15/2020 1435   CALCIUM 9.5 02/15/2020 1435   PROT 6.8 01/24/2020 0639   PROT 7.3 09/23/2019 1632   ALBUMIN 3.0 (L) 01/24/2020 0639   ALBUMIN 4.5 09/23/2019 1632   AST 37  01/24/2020 0639   ALT 47 (H) 01/24/2020 0639   ALKPHOS 51 01/24/2020 0639   BILITOT 0.6 01/24/2020 0639   BILITOT <0.2 09/23/2019 1632   GFRNONAA 98 02/15/2020 1435   GFRAA 114 02/15/2020 1435   Lab Results  Component Value Date   CHOL 146 09/23/2019   HDL 43 09/23/2019   LDLCALC 68 09/23/2019   TRIG 127 01/22/2020   CHOLHDL 3.4 09/23/2019   Lab Results  Component Value Date   HGBA1C 6.5 (H) 09/23/2019   No results found for: VITAMINB12 Lab Results  Component Value Date   TSH 1.560 10/23/2017      ASSESSMENT AND PLAN 39 y.o. year old male  has a past medical history of COVID-19, Essential hypertension (09/18/2017), and Hypertension. here with:  1. OSA on CPAP  - CPAP compliance is suboptimal usage greater than 4 hours is only 43% - Good treatment of AHI  - Encourage patient to use CPAP nightly and > 4 hours each night - F/U in 1 year or sooner if needed   I spent 20 minutes of face-to-face and non-face-to-face time with patient.  This included previsit chart review, lab review, study review, order entry, electronic health record documentation, patient education.  Butch Penny, MSN, NP-C 04/18/2020, 8:57 AM [facility] [address] [place], [place] [address] [phone]

## 2020-04-18 NOTE — Patient Instructions (Signed)
Continue using CPAP nightly and greater than 4 hours each night °If your symptoms worsen or you develop new symptoms please let us know.  ° °

## 2020-04-19 ENCOUNTER — Other Ambulatory Visit: Payer: Self-pay

## 2020-04-19 ENCOUNTER — Ambulatory Visit: Payer: BLUE CROSS/BLUE SHIELD | Admitting: Adult Health

## 2020-04-25 ENCOUNTER — Other Ambulatory Visit: Payer: Self-pay

## 2020-04-25 DIAGNOSIS — U071 COVID-19: Secondary | ICD-10-CM | POA: Diagnosis not present

## 2020-05-25 DIAGNOSIS — U071 COVID-19: Secondary | ICD-10-CM | POA: Diagnosis not present

## 2020-06-01 DIAGNOSIS — L309 Dermatitis, unspecified: Secondary | ICD-10-CM | POA: Diagnosis not present

## 2020-06-01 DIAGNOSIS — L2084 Intrinsic (allergic) eczema: Secondary | ICD-10-CM | POA: Diagnosis not present

## 2020-06-07 DIAGNOSIS — G4733 Obstructive sleep apnea (adult) (pediatric): Secondary | ICD-10-CM | POA: Diagnosis not present

## 2020-06-09 DIAGNOSIS — H5213 Myopia, bilateral: Secondary | ICD-10-CM | POA: Diagnosis not present

## 2020-07-10 ENCOUNTER — Other Ambulatory Visit: Payer: Self-pay | Admitting: Cardiovascular Disease

## 2020-07-18 IMAGING — DX DG CHEST 1V PORT
1 series · 1 of 1 positions shown · non-contrast
Comparison: 04/23/2016

CLINICAL DATA: 38-year-old male with shortness of breath. COVID
diagnosis positive 01/18/2020

EXAM:
PORTABLE CHEST 1 VIEW

[chest]
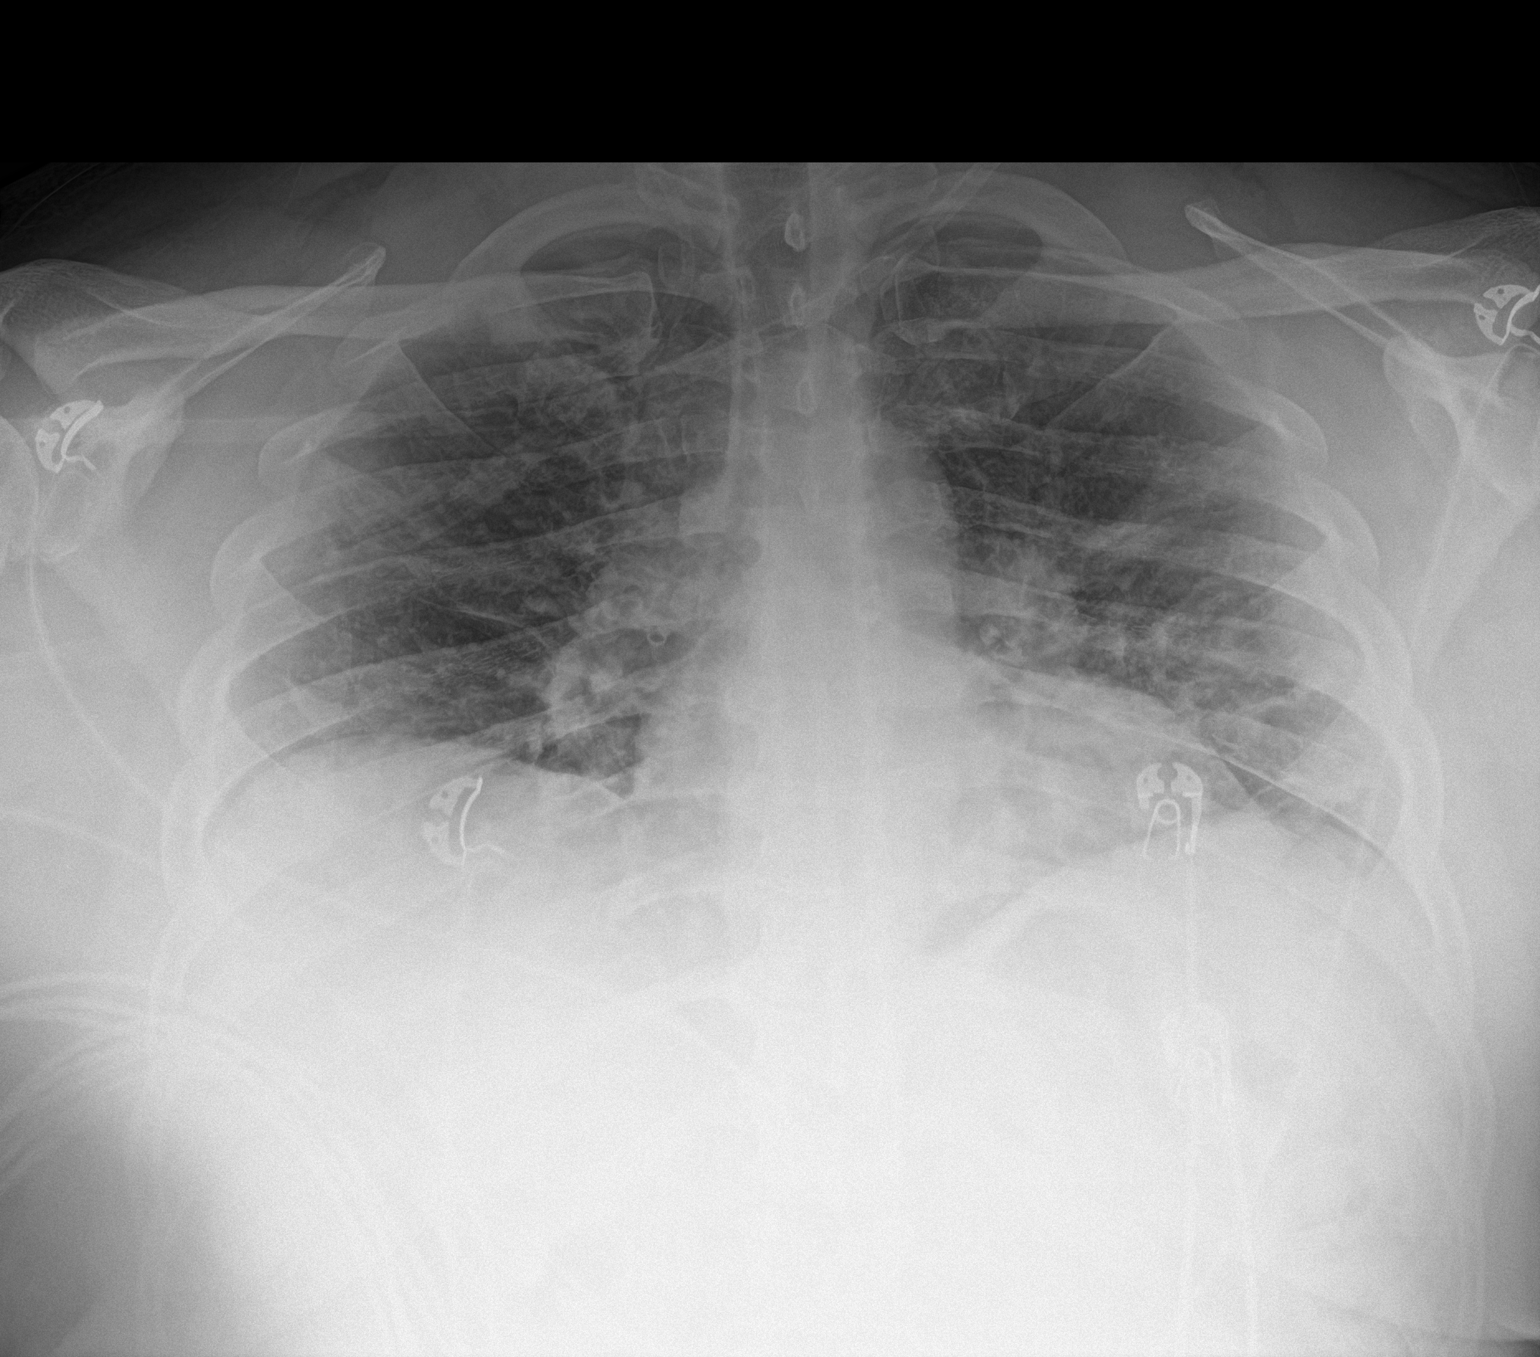

[1 of 1 positions shown; findings below may reference images not displayed]

FINDINGS: Cardiomediastinal silhouette unchanged in size and contour.

Low lung volumes with reticular opacities bilaterally. No
pneumothorax or large pleural effusion.
IMPRESSION: Low lung volumes with reticulonodular opacities compatible with
multifocal infection and the given history

## 2020-07-22 ENCOUNTER — Other Ambulatory Visit: Payer: Self-pay | Admitting: Internal Medicine

## 2020-08-01 DIAGNOSIS — G4733 Obstructive sleep apnea (adult) (pediatric): Secondary | ICD-10-CM | POA: Diagnosis not present

## 2020-09-07 DIAGNOSIS — Z03818 Encounter for observation for suspected exposure to other biological agents ruled out: Secondary | ICD-10-CM | POA: Diagnosis not present

## 2020-10-03 ENCOUNTER — Other Ambulatory Visit: Payer: Self-pay | Admitting: Internal Medicine

## 2020-10-03 ENCOUNTER — Encounter: Payer: BC Managed Care – PPO | Admitting: Internal Medicine

## 2020-10-05 ENCOUNTER — Encounter: Payer: Self-pay | Admitting: Internal Medicine

## 2020-10-05 ENCOUNTER — Other Ambulatory Visit: Payer: Self-pay

## 2020-10-05 ENCOUNTER — Ambulatory Visit: Payer: BC Managed Care – PPO | Admitting: Internal Medicine

## 2020-10-05 VITALS — BP 132/84 | HR 77 | Temp 97.9°F | Ht 67.6 in | Wt 247.8 lb

## 2020-10-05 DIAGNOSIS — I10 Essential (primary) hypertension: Secondary | ICD-10-CM | POA: Diagnosis not present

## 2020-10-05 DIAGNOSIS — Z0000 Encounter for general adult medical examination without abnormal findings: Secondary | ICD-10-CM

## 2020-10-05 DIAGNOSIS — R7309 Other abnormal glucose: Secondary | ICD-10-CM | POA: Diagnosis not present

## 2020-10-05 DIAGNOSIS — Z6838 Body mass index (BMI) 38.0-38.9, adult: Secondary | ICD-10-CM

## 2020-10-05 LAB — POCT URINALYSIS DIPSTICK
Bilirubin, UA: NEGATIVE
Blood, UA: NEGATIVE
Glucose, UA: NEGATIVE
Ketones, UA: NEGATIVE
Leukocytes, UA: NEGATIVE
Nitrite, UA: NEGATIVE
Protein, UA: NEGATIVE
Spec Grav, UA: >=1.03 — AB (ref 1.010–1.025)
Urobilinogen, UA: 0.2 E.U./dL
pH, UA: 5.5 (ref 5.0–8.0)

## 2020-10-05 LAB — POCT UA - MICROALBUMIN
Albumin/Creatinine Ratio, Urine, POC: <30
Creatinine, POC: 200 mg/dL
Microalbumin Ur, POC: 30 mg/L

## 2020-10-05 MED ORDER — CHLORTHALIDONE 25 MG PO TABS: 25.0000 mg | ORAL_TABLET | Freq: Every day | ORAL | 1 refills | Status: DC

## 2020-10-05 MED ORDER — METOPROLOL SUCCINATE ER 25 MG PO TB24: ORAL_TABLET | ORAL | 1 refills | Status: DC

## 2020-10-05 MED ORDER — IRBESARTAN 300 MG PO TABS: 300.0000 mg | ORAL_TABLET | Freq: Every day | ORAL | 2 refills | Status: DC

## 2020-10-05 NOTE — Progress Notes (Signed)
I,Katawbba Wiggins,acting as a Education administrator for [name], MD.,have documented all relevant documentation on the behalf of [name], MD,as directed by  [name], MD while in the presence of [name], MD.  This visit occurred during the SARS-CoV-2 public health emergency.  Safety protocols were in place, including screening questions prior to the visit, additional usage of staff PPE, and extensive cleaning of exam room while observing appropriate contact time as indicated for disinfecting solutions.  Subjective:     Patient ID: Austin [name] , male    DOB: 12-Nov-1981 , 39 y.o.   MRN: [id number]   Chief Complaint  Patient presents with  . Annual Exam  . Hypertension    HPI  He is here today for a full physical exam. He has no specific concerns or complaints at this time. He reports compliance with meds.   Hypertension This is a chronic problem. The current episode started more than 1 year ago. The problem has been gradually improving since onset. The problem is controlled. Pertinent negatives include no blurred vision, chest pain, palpitations or shortness of breath. Risk factors for coronary artery disease include sedentary lifestyle, male gender and obesity. Past treatments include beta blockers, calcium channel blockers and diuretics. The current treatment provides moderate improvement. Compliance problems include exercise.      Past Medical History:  Diagnosis Date  . COVID-19   . Essential hypertension 09/18/2017  . Hypertension      Family History  Problem Relation Age of Onset  . Diabetes Mother   . Healthy Sister   . Healthy Brother   . Hypertension Maternal Grandmother   . Stroke Maternal Grandmother   . Multiple sclerosis Father      Current Outpatient Medications:  .  amLODipine (NORVASC) 10 MG tablet, TAKE 1 TABLET(10 MG) BY MOUTH DAILY, Disp: 90 tablet, Rfl: 1 .  loratadine (CLARITIN) 10 MG tablet, Take 10 mg by mouth daily., Disp: , Rfl:  .   MAGNESIUM CITRATE PO, Take 500 mg by mouth at bedtime. , Disp: , Rfl:  .  metoprolol succinate (TOPROL-XL) 25 MG 24 hr tablet, TAKE 1 TABLET(25 MG) BY MOUTH AT BEDTIME, Disp: 90 tablet, Rfl: 1 .  modafinil (PROVIGIL) 200 MG tablet, TAKE 1 TABLET BY MOUTH DAILY (Patient taking differently: Take 200 mg by mouth daily as needed. ), Disp: 30 tablet, Rfl: 5 .  chlorthalidone (HYGROTON) 25 MG tablet, Take 1 tablet (25 mg total) by mouth daily., Disp: 90 tablet, Rfl: 1 .  irbesartan (AVAPRO) 300 MG tablet, Take 1 tablet (300 mg total) by mouth daily., Disp: 90 tablet, Rfl: 2   No Known Allergies   Men's preventive visit. Patient Health Questionnaire (PHQ-2) is    Office Visit from 10/05/2020 in [facility]  PHQ-2 Total Score 0    . Patient is on a low sodium diet. Marital status: Married. Relevant history for alcohol use is:  Social History   Substance and Sexual Activity  Alcohol Use Yes  . Alcohol/week: 2.0 standard drinks  . Types: 2 Standard drinks or equivalent per week  . Relevant history for tobacco use is:  Social History   Tobacco Use  Smoking Status Never Smoker  Smokeless Tobacco Never Used  .   Review of Systems  Constitutional: Negative.   HENT: Negative.   Eyes: Negative.  Negative for blurred vision.  Respiratory: Negative.  Negative for shortness of breath.   Cardiovascular: Negative.  Negative for chest pain and  palpitations.  Gastrointestinal: Negative.   Endocrine: Negative.   Genitourinary: Negative.   Musculoskeletal: Negative.   Skin: Negative.   Allergic/Immunologic: Negative.   Neurological: Negative.   Hematological: Negative.   Psychiatric/Behavioral: Negative.      Today's Vitals   10/05/20 1014  BP: 132/84  Pulse: 77  Temp: 97.9 F (36.6 C)  TempSrc: Oral  Weight: 247 lb 12.8 oz (112.4 kg)  Height: 5' 7.6" (1.717 m)   Body mass index is 38.13 kg/m.  Wt Readings from Last 3 Encounters:  10/05/20 247 lb 12.8 oz (112.4  kg)  04/18/20 251 lb (113.9 kg)  04/06/20 246 lb (111.6 kg)   Objective:  Physical Exam Vitals and nursing note reviewed.  Constitutional:      Appearance: Normal appearance. He is obese.  HENT:     Head: Normocephalic and atraumatic.     Right Ear: Tympanic membrane, ear canal and external ear normal.     Left Ear: Tympanic membrane, ear canal and external ear normal.     Nose:     Comments: Deferred, masked    Mouth/Throat:     Comments: Deferred, masked Eyes:     Extraocular Movements: Extraocular movements intact.     Conjunctiva/sclera: Conjunctivae normal.     Pupils: Pupils are equal, round, and reactive to light.  Cardiovascular:     Rate and Rhythm: Normal rate and regular rhythm.     Pulses: Normal pulses.     Heart sounds: Normal heart sounds.  Pulmonary:     Effort: Pulmonary effort is normal.     Breath sounds: Normal breath sounds.  Chest:     Breasts:        Right: Normal. No swelling, bleeding, inverted nipple, mass or nipple discharge.        Left: Normal. No swelling, bleeding, inverted nipple, mass or nipple discharge.  Abdominal:     General: Abdomen is flat. Bowel sounds are normal.     Palpations: Abdomen is soft.  Genitourinary:    Comments: Deferred Musculoskeletal:        General: Normal range of motion.     Cervical back: Normal range of motion and neck supple.  Skin:    General: Skin is warm.  Neurological:     General: No focal deficit present.     Mental Status: He is alert and oriented to person, place, and time.  Psychiatric:        Mood and Affect: Mood normal.        Behavior: Behavior normal.         Assessment And Plan:    1. Encounter for annual physical exam Comments: A full exam was performed. DRE deferred. PATIENT IS ADVISED TO GET 30-45 MINUTES REGULAR EXERCISE NO LESS THAN FOUR TO FIVE DAYS PER WEEK - BOTH WEIGHTBEARING EXERCISES AND AEROBIC ARE RECOMMENDED.  PATIENT IS ADVISED TO FOLLOW A HEALTHY DIET WITH AT LEAST SIX  FRUITS/VEGGIES PER DAY, DECREASE INTAKE OF RED MEAT, AND TO INCREASE FISH INTAKE TO TWO DAYS PER WEEK.  MEATS/FISH SHOULD NOT BE FRIED, BAKED OR BROILED IS PREFERABLE.  I SUGGEST WEARING SPF 50 SUNSCREEN ON EXPOSED PARTS AND ESPECIALLY WHEN IN THE DIRECT SUNLIGHT FOR AN EXTENDED PERIOD OF TIME.  PLEASE AVOID FAST FOOD RESTAURANTS AND INCREASE YOUR WATER INTAKE.  - CMP14+EGFR - CBC - Lipid panel - Hemoglobin A1c  2. Essential hypertension, benign Comments: Chronic, fair control. He will continue with current meds. He is encouraged to avoid adding salt to  her foods. EKG performed, NSR w/o acute changes. He will rto in six months for re-evaluation.  - EKG 12-Lead - POCT Urinalysis Dipstick (81002) - POCT UA - Microalbumin  3. Other abnormal glucose Comments: HIs a1c has been elevated in the past, I will recheck this today. Encouraged to avoid sugary beverages and processed foods.   4. Class 2 severe obesity due to excess calories with serious comorbidity and body mass index (BMI) of 38.0 to 38.9 in adult [facility]) Comments: He is encouraged to strive for BMI less than 30 to decrease cardiac risk. Advised to aim for at least 150 minutes per week.  We also discussed the use of GLP-1 agonists to help with weight loss.    Patient was given opportunity to ask questions. Patient verbalized understanding of the plan and was able to repeat key elements of the plan. All questions were answered to their satisfaction.   [name], MD   I, [name], MD, have reviewed all documentation for this visit. The documentation on 10/22/20 for the exam, diagnosis, procedures, and orders are all accurate and complete.  THE PATIENT IS ENCOURAGED TO PRACTICE SOCIAL DISTANCING DUE TO THE COVID-19 PANDEMIC.

## 2020-10-05 NOTE — Patient Instructions (Signed)

## 2020-10-06 LAB — CMP14+EGFR
ALT: 45 IU/L — ABNORMAL HIGH (ref 0–44)
AST: 27 IU/L (ref 0–40)
Albumin/Globulin Ratio: 1.6 (ref 1.2–2.2)
Albumin: 4.5 g/dL (ref 4.0–5.0)
Alkaline Phosphatase: 79 IU/L (ref 44–121)
BUN/Creatinine Ratio: 16 (ref 9–20)
BUN: 15 mg/dL (ref 6–24)
Bilirubin Total: 0.4 mg/dL (ref 0.0–1.2)
CO2: 26 mmol/L (ref 20–29)
Calcium: 9.6 mg/dL (ref 8.7–10.2)
Chloride: 102 mmol/L (ref 96–106)
Creatinine, Ser: 0.95 mg/dL (ref 0.76–1.27)
GFR calc Af Amer: 114 mL/min/{1.73_m2} (ref >59)
GFR calc non Af Amer: 99 mL/min/{1.73_m2} (ref >59)
Globulin, Total: 2.8 g/dL (ref 1.5–4.5)
Glucose: 112 mg/dL — ABNORMAL HIGH (ref 65–99)
Potassium: 4.1 mmol/L (ref 3.5–5.2)
Sodium: 144 mmol/L (ref 134–144)
Total Protein: 7.3 g/dL (ref 6.0–8.5)

## 2020-10-06 LAB — LIPID PANEL
Chol/HDL Ratio: 3.4 ratio (ref 0.0–5.0)
Cholesterol, Total: 160 mg/dL (ref 100–199)
HDL: 47 mg/dL (ref >39)
LDL Chol Calc (NIH): 96 mg/dL (ref 0–99)
Triglycerides: 90 mg/dL (ref 0–149)
VLDL Cholesterol Cal: 17 mg/dL (ref 5–40)

## 2020-10-06 LAB — CBC
Hematocrit: 47.1 % (ref 37.5–51.0)
Hemoglobin: 14.7 g/dL (ref 13.0–17.7)
MCH: 25.1 pg — ABNORMAL LOW (ref 26.6–33.0)
MCHC: 31.2 g/dL — ABNORMAL LOW (ref 31.5–35.7)
MCV: 80 fL (ref 79–97)
Platelets: 176 10*3/uL (ref 150–450)
RBC: 5.86 x10E6/uL — ABNORMAL HIGH (ref 4.14–5.80)
RDW: 14.7 % (ref 11.6–15.4)
WBC: 6 10*3/uL (ref 3.4–10.8)

## 2020-10-06 LAB — HEMOGLOBIN A1C
Est. average glucose Bld gHb Est-mCnc: 154 mg/dL
Hgb A1c MFr Bld: 7 % — ABNORMAL HIGH (ref 4.8–5.6)

## 2020-10-10 ENCOUNTER — Ambulatory Visit: Payer: BC Managed Care – PPO | Attending: Internal Medicine

## 2020-10-10 DIAGNOSIS — Z23 Encounter for immunization: Secondary | ICD-10-CM

## 2020-10-10 NOTE — Progress Notes (Signed)
   Covid-19 Vaccination Clinic  Name:  AVRAJ LINDROTH    MRN: [id number] DOB: [birth year]  10/10/2020  Mr. Gengler was observed post Covid-19 immunization for 15 minutes without incident. He was provided with Vaccine Information Sheet and instruction to access the V-Safe system.   Mr. Burkett was instructed to call 911 with any severe reactions post vaccine: Marland Kitchen Difficulty breathing  . Swelling of face and throat  . A fast heartbeat  . A bad rash all over body  . Dizziness and weakness

## 2020-10-24 ENCOUNTER — Ambulatory Visit: Payer: BC Managed Care – PPO

## 2020-10-24 ENCOUNTER — Other Ambulatory Visit: Payer: Self-pay

## 2020-10-24 VITALS — BP 132/80 | HR 88 | Temp 98.1°F | Ht 67.4 in | Wt 247.4 lb

## 2020-10-24 DIAGNOSIS — R7309 Other abnormal glucose: Secondary | ICD-10-CM

## 2020-10-24 NOTE — Progress Notes (Signed)
Patient is here today for ozempic teaching. His recent a1c was 7.0. pt was taught and he self injected today

## 2020-10-27 DIAGNOSIS — G4733 Obstructive sleep apnea (adult) (pediatric): Secondary | ICD-10-CM | POA: Diagnosis not present

## 2020-11-30 ENCOUNTER — Other Ambulatory Visit: Payer: Self-pay

## 2020-11-30 ENCOUNTER — Ambulatory Visit: Payer: BC Managed Care – PPO | Admitting: Internal Medicine

## 2020-11-30 ENCOUNTER — Encounter: Payer: Self-pay | Admitting: Internal Medicine

## 2020-11-30 VITALS — BP 132/86 | HR 76 | Temp 98.0°F | Ht 67.4 in | Wt 247.2 lb

## 2020-11-30 DIAGNOSIS — E1165 Type 2 diabetes mellitus with hyperglycemia: Secondary | ICD-10-CM

## 2020-11-30 DIAGNOSIS — L309 Dermatitis, unspecified: Secondary | ICD-10-CM | POA: Diagnosis not present

## 2020-11-30 DIAGNOSIS — F10920 Alcohol use, unspecified with intoxication, uncomplicated: Secondary | ICD-10-CM | POA: Insufficient documentation

## 2020-11-30 DIAGNOSIS — L280 Lichen simplex chronicus: Secondary | ICD-10-CM | POA: Diagnosis not present

## 2020-11-30 DIAGNOSIS — L2084 Intrinsic (allergic) eczema: Secondary | ICD-10-CM | POA: Diagnosis not present

## 2020-11-30 DIAGNOSIS — Z6838 Body mass index (BMI) 38.0-38.9, adult: Secondary | ICD-10-CM | POA: Diagnosis not present

## 2020-11-30 MED ORDER — OZEMPIC (0.25 OR 0.5 MG/DOSE) 2 MG/1.5ML ~~LOC~~ SOPN
PEN_INJECTOR | SUBCUTANEOUS | 1 refills | Status: DC
Start: 2020-11-30 — End: 2021-02-01

## 2020-11-30 NOTE — Progress Notes (Signed)
I,Yamilka Roman [employer] as a Neurosurgeon for Gwynneth Aliment, MD.,have documented all relevant documentation on the behalf of Gwynneth Aliment, MD,as directed by  Gwynneth Aliment, MD while in the presence of Gwynneth Aliment, MD. This visit occurred during the SARS-CoV-2 public health emergency.  Safety protocols were in place, including screening questions prior to the visit, additional usage of staff PPE, and extensive cleaning of exam room while observing appropriate contact time as indicated for disinfecting solutions.  Subjective:     Patient ID: Austin [name] , male    DOB: [birth year] , 39 y.o.   MRN: [id number]   Chief Complaint  Patient presents with  . Abnormal glucose f/u    HPI  Patient is here for a f/u on his abnormal glucose/new onset diabetes. He was last seen Oct 2021 for BP check, bloodwork revealed a1c 7.0. He was started on Ozempic. He has not had any issues with meds. Can't say he feels any different while on meds.     Past Medical History:  Diagnosis Date  . COVID-19   . Essential hypertension 09/18/2017  . Hypertension      Family History  Problem Relation Age of Onset  . Diabetes Mother   . Healthy Sister   . Healthy Brother   . Hypertension Maternal Grandmother   . Stroke Maternal Grandmother   . Multiple sclerosis Father      Current Outpatient Medications:  .  amLODipine (NORVASC) 10 MG tablet, TAKE 1 TABLET(10 MG) BY MOUTH DAILY, Disp: 90 tablet, Rfl: 1 .  chlorthalidone (HYGROTON) 25 MG tablet, Take 1 tablet (25 mg total) by mouth daily., Disp: 90 tablet, Rfl: 1 .  irbesartan (AVAPRO) 300 MG tablet, Take 1 tablet (300 mg total) by mouth daily., Disp: 90 tablet, Rfl: 2 .  loratadine (CLARITIN) 10 MG tablet, Take 10 mg by mouth daily., Disp: , Rfl:  .  MAGNESIUM CITRATE PO, Take 500 mg by mouth at bedtime. , Disp: , Rfl:  .  metoprolol succinate (TOPROL-XL) 25 MG 24 hr tablet, TAKE 1 TABLET(25 MG) BY MOUTH AT BEDTIME, Disp: 90 tablet, Rfl: 1 .   modafinil (PROVIGIL) 200 MG tablet, TAKE 1 TABLET BY MOUTH DAILY (Patient taking differently: Take 200 mg by mouth daily as needed. ), Disp: 30 tablet, Rfl: 5 .  Semaglutide,0.25 or 0.5MG /DOS, (OZEMPIC, 0.25 OR 0.5 MG/DOSE,) 2 MG/1.5ML SOPN, Inject 0.5mg  once weekly, Disp: 1.5 mL, Rfl: 1   No Known Allergies   Review of Systems  Constitutional: Negative.   Respiratory: Negative.   Cardiovascular: Negative.   Gastrointestinal: Negative.   Neurological: Negative.   Psychiatric/Behavioral: Negative.      Today's Vitals   11/30/20 1135  BP: 132/86  Pulse: 76  Temp: 98 F (36.7 C)  TempSrc: Oral  Weight: 247 lb 3.2 oz (112.1 kg)  Height: 5' 7.4" (1.712 m)   Body mass index is 38.26 kg/m.  Wt Readings from Last 3 Encounters:  11/30/20 247 lb 3.2 oz (112.1 kg)  10/24/20 247 lb 6.4 oz (112.2 kg)  10/05/20 247 lb 12.8 oz (112.4 kg)   Objective:  Physical Exam Vitals and nursing note reviewed.  Constitutional:      Appearance: Normal appearance.  Cardiovascular:     Rate and Rhythm: Normal rate and regular rhythm.     Heart sounds: Normal heart sounds.  Pulmonary:     Effort: Pulmonary effort is normal.     Breath sounds: Normal breath sounds.  Skin:  General: Skin is warm.  Neurological:     General: No focal deficit present.     Mental Status: He is alert.  Psychiatric:        Mood and Affect: Mood normal.         Assessment And Plan:     1. Uncontrolled type 2 diabetes mellitus with hyperglycemia (HCC) Comments: New onset. I will increase Ozempic to 0.5mg  once weekly. Reminded to stop eating when full. Denies fam hx thyroid cancer. He will f/u in 2 months.   2. Class 2 severe obesity due to excess calories with serious comorbidity and body mass index (BMI) of 38.0 to 38.9 in adult [facility]) Comments: He has yet to experience any weight loss.  We will likely see this with higher doses of Ozempic.     Patient was given opportunity to ask questions. Patient  verbalized understanding of the plan and was able to repeat key elements of the plan. All questions were answered to their satisfaction.  Gwynneth Aliment, MD   I, Gwynneth Aliment, MD, have reviewed all documentation for this visit. The documentation on 12/10/20 for the exam, diagnosis, procedures, and orders are all accurate and complete.  THE PATIENT IS ENCOURAGED TO PRACTICE SOCIAL DISTANCING DUE TO THE COVID-19 PANDEMIC.

## 2020-11-30 NOTE — Patient Instructions (Signed)

## 2020-12-05 ENCOUNTER — Ambulatory Visit: Payer: BC Managed Care – PPO | Admitting: Internal Medicine

## 2021-01-02 DIAGNOSIS — G4733 Obstructive sleep apnea (adult) (pediatric): Secondary | ICD-10-CM | POA: Diagnosis not present

## 2021-01-04 DIAGNOSIS — L280 Lichen simplex chronicus: Secondary | ICD-10-CM | POA: Diagnosis not present

## 2021-01-11 DIAGNOSIS — Z03818 Encounter for observation for suspected exposure to other biological agents ruled out: Secondary | ICD-10-CM | POA: Diagnosis not present

## 2021-01-11 DIAGNOSIS — Z20822 Contact with and (suspected) exposure to covid-19: Secondary | ICD-10-CM | POA: Diagnosis not present

## 2021-02-01 ENCOUNTER — Other Ambulatory Visit: Payer: Self-pay | Admitting: Internal Medicine

## 2021-02-26 ENCOUNTER — Other Ambulatory Visit: Payer: Self-pay

## 2021-02-26 ENCOUNTER — Ambulatory Visit: Payer: BC Managed Care – PPO | Admitting: Internal Medicine

## 2021-02-26 ENCOUNTER — Encounter: Payer: Self-pay | Admitting: Internal Medicine

## 2021-02-26 VITALS — BP 138/92 | HR 83 | Temp 98.1°F | Ht 64.4 in | Wt 245.4 lb

## 2021-02-26 DIAGNOSIS — R209 Unspecified disturbances of skin sensation: Secondary | ICD-10-CM

## 2021-02-26 DIAGNOSIS — E1165 Type 2 diabetes mellitus with hyperglycemia: Secondary | ICD-10-CM | POA: Diagnosis not present

## 2021-02-26 DIAGNOSIS — Z79891 Long term (current) use of opiate analgesic: Secondary | ICD-10-CM | POA: Diagnosis not present

## 2021-02-26 DIAGNOSIS — I10 Essential (primary) hypertension: Secondary | ICD-10-CM | POA: Diagnosis not present

## 2021-02-26 DIAGNOSIS — Z6841 Body Mass Index (BMI) 40.0 and over, adult: Secondary | ICD-10-CM

## 2021-02-26 MED ORDER — AMLODIPINE BESYLATE 10 MG PO TABS
ORAL_TABLET | ORAL | 1 refills | Status: DC
Start: 2021-02-26 — End: 2021-09-28

## 2021-02-26 MED ORDER — CHLORTHALIDONE 25 MG PO TABS
25.0000 mg | ORAL_TABLET | Freq: Every day | ORAL | 1 refills | Status: DC
Start: 2021-02-26 — End: 2021-07-17

## 2021-02-26 MED ORDER — OZEMPIC (1 MG/DOSE) 4 MG/3ML ~~LOC~~ SOPN: 1.0000 mg | PEN_INJECTOR | SUBCUTANEOUS | 3 refills | Status: DC

## 2021-02-26 NOTE — Patient Instructions (Signed)
Diabetes Mellitus and Foot Care Foot care is an important part of your health, especially when you have diabetes. Diabetes may cause you to have problems because of poor blood flow (circulation) to your feet and legs, which can cause your skin to:  Become thinner and drier.  Break more easily.  Heal more slowly.  Peel and crack. You may also have nerve damage (neuropathy) in your legs and feet, causing decreased feeling in them. This means that you may not notice minor injuries to your feet that could lead to more serious problems. Noticing and addressing any potential problems early is the best way to prevent future foot problems. How to care for your feet Foot hygiene  Wash your feet daily with warm water and mild soap. Do not use hot water. Then, pat your feet and the areas between your toes until they are completely dry. Do not soak your feet as this can dry your skin.  Trim your toenails straight across. Do not dig under them or around the cuticle. File the edges of your nails with an emery board or nail file.  Apply a moisturizing lotion or petroleum jelly to the skin on your feet and to dry, brittle toenails. Use lotion that does not contain alcohol and is unscented. Do not apply lotion between your toes.   Shoes and socks  Wear clean socks or stockings every day. Make sure they are not too tight. Do not wear knee-high stockings since they may decrease blood flow to your legs.  Wear shoes that fit properly and have enough cushioning. Always look in your shoes before you put them on to be sure there are no objects inside.  To break in new shoes, wear them for just a few hours a day. This prevents injuries on your feet. Wounds, scrapes, corns, and calluses  Check your feet daily for blisters, cuts, bruises, sores, and redness. If you cannot see the bottom of your feet, use a mirror or ask someone for help.  Do not cut corns or calluses or try to remove them with medicine.  If you  find a minor scrape, cut, or break in the skin on your feet, keep it and the skin around it clean and dry. You may clean these areas with mild soap and water. Do not clean the area with peroxide, alcohol, or iodine.  If you have a wound, scrape, corn, or callus on your foot, look at it several times a day to make sure it is healing and not infected. Check for: ? Redness, swelling, or pain. ? Fluid or blood. ? Warmth. ? Pus or a bad smell.   General tips  Do not cross your legs. This may decrease blood flow to your feet.  Do not use heating pads or hot water bottles on your feet. They may burn your skin. If you have lost feeling in your feet or legs, you may not know this is happening until it is too late.  Protect your feet from hot and cold by wearing shoes, such as at the beach or on hot pavement.  Schedule a complete foot exam at least once a year (annually) or more often if you have foot problems. Report any cuts, sores, or bruises to your health care provider immediately. Where to find more information  American Diabetes Association: www.diabetes.org  Association of Diabetes Care & Education Specialists: www.diabeteseducator.org Contact a health care provider if:  You have a medical condition that increases your risk of infection and   you have any cuts, sores, or bruises on your feet.  You have an injury that is not healing.  You have redness on your legs or feet.  You feel burning or tingling in your legs or feet.  You have pain or cramps in your legs and feet.  Your legs or feet are numb.  Your feet always feel cold.  You have pain around any toenails. Get help right away if:  You have a wound, scrape, corn, or callus on your foot and: ? You have pain, swelling, or redness that gets worse. ? You have fluid or blood coming from the wound, scrape, corn, or callus. ? Your wound, scrape, corn, or callus feels warm to the touch. ? You have pus or a bad smell coming from  the wound, scrape, corn, or callus. ? You have a fever. ? You have a red line going up your leg. Summary  Check your feet every day for blisters, cuts, bruises, sores, and redness.  Apply a moisturizing lotion or petroleum jelly to the skin on your feet and to dry, brittle toenails.  Wear shoes that fit properly and have enough cushioning.  If you have foot problems, report any cuts, sores, or bruises to your health care provider immediately.  Schedule a complete foot exam at least once a year (annually) or more often if you have foot problems. This information is not intended to replace advice given to you by your health care provider. Make sure you discuss any questions you have with your health care provider. Document Revised: 07/06/2020 Document Reviewed: 07/06/2020 Elsevier Patient Education  2021 Elsevier Inc.  

## 2021-02-26 NOTE — Progress Notes (Signed)
I,Katawbba Wiggins,acting as a Education administrator for [name], MD.,have documented all relevant documentation on the behalf of [name], MD,as directed by  [name], MD while in the presence of [name], MD.  This visit occurred during the SARS-CoV-2 public health emergency.  Safety protocols were in place, including screening questions prior to the visit, additional usage of staff PPE, and extensive cleaning of exam room while observing appropriate contact time as indicated for disinfecting solutions.  Subjective:     Patient ID: Austin [name] , male    DOB: Feb 15, 1981 , 40 y.o.   MRN: [id number]   Chief Complaint  Patient presents with  . Diabetes  . Hypertension    HPI  The patient presents for hus diabetes and blood pressure f/u. He was started on Ozempic after his last visit. He has not had any issues with the medication. He denies headaches, chest pain and shortness of breath. Diabetes He presents for his follow-up diabetic visit. He has type 2 diabetes mellitus. There are no hypoglycemic associated symptoms. Pertinent negatives for diabetes include no blurred vision and no chest pain. There are no hypoglycemic complications. Risk factors for coronary artery disease include diabetes mellitus, hypertension, male sex and obesity. An ACE inhibitor/angiotensin II receptor blocker is being taken.  Hypertension This is a chronic problem. The current episode started more than 1 year ago. The problem has been gradually improving since onset. The problem is controlled. Pertinent negatives include no blurred vision, chest pain, palpitations or shortness of breath. The current treatment provides moderate improvement. Compliance problems include exercise.      Past Medical History:  Diagnosis Date  . COVID-19   . Essential hypertension 09/18/2017  . Hypertension      Family History  Problem Relation Age of Onset  . Diabetes Mother   . Healthy Sister   . Healthy Brother    . Hypertension Maternal Grandmother   . Stroke Maternal Grandmother   . Multiple sclerosis Father      Current Outpatient Medications:  .  irbesartan (AVAPRO) 300 MG tablet, Take 1 tablet (300 mg total) by mouth daily., Disp: 90 tablet, Rfl: 2 .  loratadine (CLARITIN) 10 MG tablet, Take 10 mg by mouth daily., Disp: , Rfl:  .  MAGNESIUM CITRATE PO, Take 500 mg by mouth at bedtime. , Disp: , Rfl:  .  metoprolol succinate (TOPROL-XL) 25 MG 24 hr tablet, TAKE 1 TABLET(25 MG) BY MOUTH AT BEDTIME, Disp: 90 tablet, Rfl: 1 .  modafinil (PROVIGIL) 200 MG tablet, TAKE 1 TABLET BY MOUTH DAILY (Patient taking differently: Take 200 mg by mouth daily as needed.), Disp: 30 tablet, Rfl: 5 .  Semaglutide, 1 MG/DOSE, (OZEMPIC, 1 MG/DOSE,) 4 MG/3ML SOPN, Inject 1 mg into the skin once a week., Disp: 3 mL, Rfl: 3 .  amLODipine (NORVASC) 10 MG tablet, TAKE 1 TABLET(10 MG) BY MOUTH DAILY, Disp: 90 tablet, Rfl: 1 .  chlorthalidone (HYGROTON) 25 MG tablet, Take 1 tablet (25 mg total) by mouth daily., Disp: 90 tablet, Rfl: 1   No Known Allergies   Review of Systems  Constitutional: Negative.   Eyes: Negative for blurred vision.  Respiratory: Negative.  Negative for shortness of breath.   Cardiovascular: Negative.  Negative for chest pain and palpitations.  Gastrointestinal: Negative.   Endocrine:       He c/o cold hands/feet. Denies color changes. Unable to determine what triggers his sx.   Neurological: Negative.   Psychiatric/Behavioral: Negative.  All other systems reviewed and are negative.    Today's Vitals   02/26/21 1412  BP: (!) 138/92  Pulse: 83  Temp: 98.1 F (36.7 C)  TempSrc: Oral  Weight: 245 lb 6.4 oz (111.3 kg)  Height: 5' 4.4" (1.636 m)   Body mass index is 41.6 kg/m.  Wt Readings from Last 3 Encounters:  02/26/21 245 lb 6.4 oz (111.3 kg)  11/30/20 247 lb 3.2 oz (112.1 kg)  10/24/20 247 lb 6.4 oz (112.2 kg)   Objective:  Physical Exam Vitals and nursing note reviewed.   Constitutional:      Appearance: Normal appearance. He is obese.  HENT:     Head: Normocephalic and atraumatic.     Nose:     Comments: Masked     Mouth/Throat:     Comments: Masked  Cardiovascular:     Rate and Rhythm: Normal rate and regular rhythm.     Heart sounds: Normal heart sounds.  Pulmonary:     Breath sounds: Normal breath sounds.  Musculoskeletal:     Cervical back: Normal range of motion.  Skin:    General: Skin is warm.  Neurological:     General: No focal deficit present.     Mental Status: He is alert and oriented to person, place, and time.         Assessment And Plan:     1. Uncontrolled type 2 diabetes mellitus with hyperglycemia (HCC) Comments: Chronic, I will check labs as listed below. Encouraged to aim for at least 150 minutes of exercise per week.  - Hemoglobin A1c - BMP8+EGFR  2. Essential hypertension, benign Comments: Chronic, fair control. Advised to avoid adding salt to his foods. Also encouraged to limit his intake of packaged foods, deli meats, breads and cheese.  3. Cold hands and feet Comments: I will check labs as listed below. Denies symptoms of Raynaud's.  - TSH - ANA, IFA (with reflex)  4. Class 3 severe obesity due to excess calories with serious comorbidity and body mass index (BMI) of 40.0 to 44.9 in adult [facility]) Comments: BMI 41. He is encouraged to strive to lose ten percent of his body weight to decrease cardiac risk.   Patient was given opportunity to ask questions. Patient verbalized understanding of the plan and was able to repeat key elements of the plan. All questions were answered to their satisfaction.   I, [name], MD, have reviewed all documentation for this visit. The documentation on 03/10/21 for the exam, diagnosis, procedures, and orders are all accurate and complete.  THE PATIENT IS ENCOURAGED TO PRACTICE SOCIAL DISTANCING DUE TO THE COVID-19 PANDEMIC.

## 2021-03-03 LAB — BMP8+EGFR
BUN/Creatinine Ratio: 14 (ref 9–20)
BUN: 14 mg/dL (ref 6–24)
CO2: 25 mmol/L (ref 20–29)
Calcium: 9.2 mg/dL (ref 8.7–10.2)
Chloride: 99 mmol/L (ref 96–106)
Creatinine, Ser: 1.03 mg/dL (ref 0.76–1.27)
Glucose: 139 mg/dL — ABNORMAL HIGH (ref 65–99)
Potassium: 3.4 mmol/L — ABNORMAL LOW (ref 3.5–5.2)
Sodium: 139 mmol/L (ref 134–144)
eGFR: 94 mL/min/{1.73_m2} (ref >59)

## 2021-03-03 LAB — HEMOGLOBIN A1C
Est. average glucose Bld gHb Est-mCnc: 134 mg/dL
Hgb A1c MFr Bld: 6.3 % — ABNORMAL HIGH (ref 4.8–5.6)

## 2021-03-03 LAB — ANTINUCLEAR ANTIBODIES, IFA: ANA Titer 1: NEGATIVE

## 2021-03-03 LAB — TSH: TSH: 1.54 u[IU]/mL (ref 0.450–4.500)

## 2021-04-05 ENCOUNTER — Ambulatory Visit: Payer: BC Managed Care – PPO | Admitting: Internal Medicine

## 2021-04-05 DIAGNOSIS — L280 Lichen simplex chronicus: Secondary | ICD-10-CM | POA: Diagnosis not present

## 2021-04-18 ENCOUNTER — Encounter: Payer: Self-pay | Admitting: Adult Health

## 2021-04-18 ENCOUNTER — Ambulatory Visit: Payer: BC Managed Care – PPO | Admitting: Adult Health

## 2021-04-23 DIAGNOSIS — G4733 Obstructive sleep apnea (adult) (pediatric): Secondary | ICD-10-CM | POA: Diagnosis not present

## 2021-07-05 ENCOUNTER — Encounter: Payer: Self-pay | Admitting: Internal Medicine

## 2021-07-05 ENCOUNTER — Ambulatory Visit: Payer: BC Managed Care – PPO | Admitting: Internal Medicine

## 2021-07-05 ENCOUNTER — Other Ambulatory Visit: Payer: Self-pay

## 2021-07-05 VITALS — BP 120/78 | HR 88 | Temp 97.9°F | Ht 64.4 in | Wt 236.4 lb

## 2021-07-05 DIAGNOSIS — I10 Essential (primary) hypertension: Secondary | ICD-10-CM | POA: Diagnosis not present

## 2021-07-05 DIAGNOSIS — E1165 Type 2 diabetes mellitus with hyperglycemia: Secondary | ICD-10-CM | POA: Diagnosis not present

## 2021-07-05 DIAGNOSIS — Z6841 Body Mass Index (BMI) 40.0 and over, adult: Secondary | ICD-10-CM

## 2021-07-05 DIAGNOSIS — E7800 Pure hypercholesterolemia, unspecified: Secondary | ICD-10-CM

## 2021-07-05 NOTE — Progress Notes (Signed)
I,Katawbba Wiggins,acting as a Education administrator for [name], MD.,have documented all relevant documentation on the behalf of [name], MD,as directed by  [name], MD while in the presence of [name], MD.  This visit occurred during the SARS-CoV-2 public health emergency.  Safety protocols were in place, including screening questions prior to the visit, additional usage of staff PPE, and extensive cleaning of exam room while observing appropriate contact time as indicated for disinfecting solutions.  Subjective:     Patient ID: Austin [name] , male    DOB: 03-Feb-1981 , 40 y.o.   MRN: [id number]   Chief Complaint  Patient presents with   Diabetes   Hypertension    HPI  Patient is here for a f/u on his diabetes and blood pressure.  The patient states his appointment for his diabetic eye exam is next month. He reports compliance with meds. He denies headaches, chest pain and shortness of breath. He does not report any issues with his meds.   Diabetes He presents for his follow-up diabetic visit. He has type 2 diabetes mellitus. There are no hypoglycemic associated symptoms. Pertinent negatives for diabetes include no blurred vision and no chest pain. There are no hypoglycemic complications. Risk factors for coronary artery disease include diabetes mellitus, hypertension, male sex and obesity. An ACE inhibitor/angiotensin II receptor blocker is being taken.  Hypertension This is a chronic problem. The current episode started more than 1 year ago. The problem has been gradually improving since onset. The problem is controlled. Pertinent negatives include no blurred vision, chest pain, palpitations or shortness of breath. The current treatment provides moderate improvement. Compliance problems include exercise.     Past Medical History:  Diagnosis Date   COVID-19    Essential hypertension 09/18/2017   Hypertension      Family History  Problem Relation Age of Onset    Diabetes Mother    Healthy Sister    Healthy Brother    Hypertension Maternal Grandmother    Stroke Maternal Grandmother    Multiple sclerosis Father      Current Outpatient Medications:    amLODipine (NORVASC) 10 MG tablet, TAKE 1 TABLET(10 MG) BY MOUTH DAILY, Disp: 90 tablet, Rfl: 1   clobetasol ointment (TEMOVATE) 0.05 %, Apply to worst areas of hand  twice daily. NEVER to face/groin, Disp: , Rfl:    irbesartan (AVAPRO) 300 MG tablet, Take 1 tablet (300 mg total) by mouth daily., Disp: 90 tablet, Rfl: 2   loratadine (CLARITIN) 10 MG tablet, Take 10 mg by mouth daily., Disp: , Rfl:    MAGNESIUM CITRATE PO, Take 500 mg by mouth at bedtime. , Disp: , Rfl:    metoprolol succinate (TOPROL-XL) 25 MG 24 hr tablet, TAKE 1 TABLET(25 MG) BY MOUTH AT BEDTIME, Disp: 90 tablet, Rfl: 1   modafinil (PROVIGIL) 200 MG tablet, TAKE 1 TABLET BY MOUTH DAILY (Patient taking differently: Take 200 mg by mouth daily as needed.), Disp: 30 tablet, Rfl: 5   chlorthalidone (HYGROTON) 25 MG tablet, TAKE 1 TABLET BY MOUTH EVERY DAY., Disp: 90 tablet, Rfl: 1   OZEMPIC, 1 MG/DOSE, 4 MG/3ML SOPN, INJECT 1 MG UNDER THE SKIN ONCE A WEEK, Disp: 3 mL, Rfl: 3   No Known Allergies   Review of Systems  Constitutional: Negative.   Eyes:  Negative for blurred vision.  Respiratory: Negative.  Negative for shortness of breath.   Cardiovascular: Negative.  Negative for chest pain and palpitations.  Gastrointestinal: Negative.  Psychiatric/Behavioral: Negative.    All other systems reviewed and are negative.   Today's Vitals   07/05/21 1028  BP: 120/78  Pulse: 88  Temp: 97.9 F (36.6 C)  TempSrc: Oral  Weight: 236 lb 6.4 oz (107.2 kg)  Height: 5' 4.4" (1.636 m)   Body mass index is 40.08 kg/m.  Wt Readings from Last 3 Encounters:  07/05/21 236 lb 6.4 oz (107.2 kg)  02/26/21 245 lb 6.4 oz (111.3 kg)  11/30/20 247 lb 3.2 oz (112.1 kg)    BP Readings from Last 3 Encounters:  07/05/21 120/78  02/26/21 (!) 138/92   11/30/20 132/86    Objective:  Physical Exam Vitals and nursing note reviewed.  Constitutional:      Appearance: Normal appearance.  HENT:     Head: Normocephalic and atraumatic.     Nose:     Comments: Masked     Mouth/Throat:     Comments: Masked  Cardiovascular:     Rate and Rhythm: Normal rate and regular rhythm.     Heart sounds: Normal heart sounds.  Pulmonary:     Effort: Pulmonary effort is normal.     Breath sounds: Normal breath sounds.  Musculoskeletal:     Cervical back: Normal range of motion.  Skin:    General: Skin is warm.  Neurological:     General: No focal deficit present.     Mental Status: He is alert.  Psychiatric:        Mood and Affect: Mood normal.        Assessment And Plan:     1. Uncontrolled type 2 diabetes mellitus with hyperglycemia (HCC) Comments: Chronic, I will check labs as listed below. He is encouraged to follow dietary recommendations - low glycemic foods, limit sugar intake.  - Hemoglobin A1c - CMP14+EGFR  2. Essential hypertension, benign Comments: Chronic, well controlled. He is encouraged to follow low sodium diet.   3. Pure hypercholesterolemia Comments: October 2022 LDL 96. He was advised of LDL goal for those with diabetes. WE discussed dietary modification and exercise. Does not wish to start statins at this   4. Class 3 severe obesity due to excess calories with serious comorbidity and body mass index (BMI) of 40.0 to 44.9 in adult [facility]) Comments: BMI 40. He was congratulated on his 9 lb. weight loss and encouraged to keep up the great work.    Patient was given opportunity to ask questions. Patient verbalized understanding of the plan and was able to repeat key elements of the plan. All questions were answered to their satisfaction.   I, [name], MD, have reviewed all documentation for this visit. The documentation on 07/07/21 for the exam, diagnosis, procedures, and orders are all accurate and complete.   IF  YOU HAVE BEEN REFERRED TO A SPECIALIST, IT MAY TAKE 1-2 WEEKS TO SCHEDULE/PROCESS THE REFERRAL. IF YOU HAVE NOT HEARD FROM US/SPECIALIST IN TWO WEEKS, PLEASE GIVE [place] A CALL AT [phone] X [phone].   THE PATIENT IS ENCOURAGED TO PRACTICE SOCIAL DISTANCING DUE TO THE COVID-19 PANDEMIC.

## 2021-07-06 LAB — CMP14+EGFR
ALT: 36 IU/L (ref 0–44)
AST: 23 IU/L (ref 0–40)
Albumin/Globulin Ratio: 1.6 (ref 1.2–2.2)
Albumin: 4.4 g/dL (ref 4.0–5.0)
Alkaline Phosphatase: 91 IU/L (ref 44–121)
BUN/Creatinine Ratio: 12 (ref 9–20)
BUN: 14 mg/dL (ref 6–24)
Bilirubin Total: 0.3 mg/dL (ref 0.0–1.2)
CO2: 26 mmol/L (ref 20–29)
Calcium: 9.5 mg/dL (ref 8.7–10.2)
Chloride: 102 mmol/L (ref 96–106)
Creatinine, Ser: 1.13 mg/dL (ref 0.76–1.27)
Globulin, Total: 2.8 g/dL (ref 1.5–4.5)
Glucose: 102 mg/dL — ABNORMAL HIGH (ref 65–99)
Potassium: 3.7 mmol/L (ref 3.5–5.2)
Sodium: 143 mmol/L (ref 134–144)
Total Protein: 7.2 g/dL (ref 6.0–8.5)
eGFR: 83 mL/min/{1.73_m2} (ref >59)

## 2021-07-06 LAB — HEMOGLOBIN A1C
Est. average glucose Bld gHb Est-mCnc: 128 mg/dL
Hgb A1c MFr Bld: 6.1 % — ABNORMAL HIGH (ref 4.8–5.6)

## 2021-07-08 ENCOUNTER — Other Ambulatory Visit: Payer: Self-pay | Admitting: Internal Medicine

## 2021-07-17 ENCOUNTER — Other Ambulatory Visit: Payer: Self-pay | Admitting: Internal Medicine

## 2021-07-23 DIAGNOSIS — G4733 Obstructive sleep apnea (adult) (pediatric): Secondary | ICD-10-CM | POA: Diagnosis not present

## 2021-08-05 ENCOUNTER — Other Ambulatory Visit: Payer: Self-pay | Admitting: Internal Medicine

## 2021-08-07 ENCOUNTER — Telehealth (INDEPENDENT_AMBULATORY_CARE_PROVIDER_SITE_OTHER): Payer: BC Managed Care – PPO | Admitting: Adult Health

## 2021-08-07 DIAGNOSIS — Z9989 Dependence on other enabling machines and devices: Secondary | ICD-10-CM

## 2021-08-07 DIAGNOSIS — G4733 Obstructive sleep apnea (adult) (pediatric): Secondary | ICD-10-CM

## 2021-08-07 NOTE — Progress Notes (Signed)
PATIENT: Locke D Yale DOB: October 24, 1981  REASON FOR VISIT: follow up HISTORY FROM: patient PRIMARY NEUROLOGIST:   Virtual Visit via Video Note  I connected with Bowie D Strohmeyer on 08/07/21 at  2:45 PM EDT by a video enabled telemedicine application located remotely at [facility] and verified that I am speaking with the correct person using two identifiers who was located at their own home.   I discussed the limitations of evaluation and management by telemedicine and the availability of in person appointments. The patient expressed understanding and agreed to proceed.   PATIENT: Muath D Felan DOB: December 02, 1981  REASON FOR VISIT: follow up HISTORY FROM: patient  HISTORY OF PRESENT ILLNESS: Today 08/07/21:  Mr. Seipp is a 40 year old male with a history of obstructive sleep apnea on CPAP.  He reports that the CPAP is working well for him.  He denies any new issues.  His download is below    HISTORY Mr. Wuertz is an 40 year old male with a history of OSA on cpap. His download indicates that he used his machine 29/30 for a compliance of 97%. He used his machine >4 hours 13/30 for a compliance of 43%. On average he used his machine 4 hours 16 minutes. His residual AHI is 0.6 on 9 cm H20 with EPR 3.  His leak in the 95th percentile is 14.6 L/min.  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  For this reason some nights he has a hard time using it greater than 4 hours  REVIEW OF SYSTEMS: Out of a complete 14 system review of symptoms, the patient complains only of the following symptoms, and all other reviewed systems are negative.  ALLERGIES: No Known Allergies  HOME MEDICATIONS: Outpatient Medications Prior to Visit  Medication Sig Dispense Refill   amLODipine (NORVASC) 10 MG tablet TAKE 1 TABLET(10 MG) BY MOUTH DAILY 90 tablet 1   chlorthalidone (HYGROTON) 25 MG tablet TAKE 1 TABLET BY MOUTH EVERY DAY. 90 tablet 1   clobetasol ointment (TEMOVATE) 0.05 % Apply to  worst areas of hand  twice daily. NEVER to face/groin     irbesartan (AVAPRO) 300 MG tablet TAKE 1 TABLET BY MOUTH EVERY DAY 90 tablet 2   loratadine (CLARITIN) 10 MG tablet Take 10 mg by mouth daily.     MAGNESIUM CITRATE PO Take 500 mg by mouth at bedtime.      metoprolol succinate (TOPROL-XL) 25 MG 24 hr tablet TAKE 1 TABLET(25 MG) BY MOUTH AT BEDTIME 90 tablet 1   modafinil (PROVIGIL) 200 MG tablet TAKE 1 TABLET BY MOUTH DAILY (Patient taking differently: Take 200 mg by mouth daily as needed.) 30 tablet 5   OZEMPIC, 1 MG/DOSE, 4 MG/3ML SOPN INJECT 1 MG UNDER THE SKIN ONCE A WEEK 3 mL 3   No facility-administered medications prior to visit.    PAST MEDICAL HISTORY: Past Medical History:  Diagnosis Date   COVID-19    Essential hypertension 09/18/2017   Hypertension     PAST SURGICAL HISTORY: Past Surgical History:  Procedure Laterality Date   KNEE ARTHROSCOPY WITH ANTERIOR CRUCIATE LIGAMENT (ACL) REPAIR      FAMILY HISTORY: Family History  Problem Relation Age of Onset   Diabetes Mother    Healthy Sister    Healthy Brother    Hypertension Maternal Grandmother    Stroke Maternal Grandmother    Multiple sclerosis Father     SOCIAL HISTORY: Social History   Socioeconomic History   Marital status:  Married    Spouse name: Not on file   Number of children: Not on file   Years of education: Not on file   Highest education level: Not on file  Occupational History   Not on file  Tobacco Use   Smoking status: Never   Smokeless tobacco: Never  Vaping Use   Vaping Use: Never used  Substance and Sexual Activity   Alcohol use: Yes    Alcohol/week: 2.0 standard drinks    Types: 2 Standard drinks or equivalent per week   Drug use: No   Sexual activity: Not on file  Other Topics Concern   Not on file  Social History Narrative   Not on file   Social Determinants of Health   Financial Resource Strain: Not on file  Food Insecurity: Not on file  Transportation Needs:  Not on file  Physical Activity: Not on file  Stress: Not on file  Social Connections: Not on file  Intimate Partner Violence: Not on file      PHYSICAL EXAM Generalized: Well developed, in no acute distress   Neurological examination  Mentation: Alert oriented to time, place, history taking. Follows all commands speech and language fluent Cranial nerve II-XII:Extraocular movements were full. Facial symmetry noted. uvula tongue midline. Head turning and shoulder shrug  were normal and symmetric. Motor: Good strength throughout subjectively per patient Sensory: Sensory testing is intact to soft touch on all 4 extremities subjectively per patient Reflexes: UTA  DIAGNOSTIC DATA (LABS, IMAGING, TESTING) - I reviewed patient records, labs, notes, testing and imaging myself where available.  Lab Results  Component Value Date   WBC 6.0 10/05/2020   HGB 14.7 10/05/2020   HCT 47.1 10/05/2020   MCV 80 10/05/2020   PLT 176 10/05/2020      Component Value Date/Time   NA 143 07/05/2021 1052   K 3.7 07/05/2021 1052   CL 102 07/05/2021 1052   CO2 26 07/05/2021 1052   GLUCOSE 102 (H) 07/05/2021 1052   GLUCOSE 189 (H) 01/25/2020 0719   BUN 14 07/05/2021 1052   CREATININE 1.13 07/05/2021 1052   CALCIUM 9.5 07/05/2021 1052   PROT 7.2 07/05/2021 1052   ALBUMIN 4.4 07/05/2021 1052   AST 23 07/05/2021 1052   ALT 36 07/05/2021 1052   ALKPHOS 91 07/05/2021 1052   BILITOT 0.3 07/05/2021 1052   GFRNONAA 99 10/05/2020 1147   GFRAA 114 10/05/2020 1147   Lab Results  Component Value Date   CHOL 160 10/05/2020   HDL 47 10/05/2020   LDLCALC 96 10/05/2020   TRIG 90 10/05/2020   CHOLHDL 3.4 10/05/2020   Lab Results  Component Value Date   HGBA1C 6.1 (H) 07/05/2021   No results found for: VITAMINB12 Lab Results  Component Value Date   TSH 1.540 02/26/2021      ASSESSMENT AND PLAN 40 y.o. year old male  has a past medical history of COVID-19, Essential hypertension (09/18/2017), and  Hypertension. here with:  OSA on CPAP  CPAP compliance good Residual AHI is good Encouraged patient to continue using CPAP nightly and > 4 hours each night F/U in 1 year or sooner if needed    Butch Penny, MSN, NP-C 08/07/2021, 2:37 PM [facility] [address] [place], [place] [address] [phone]

## 2021-08-15 DIAGNOSIS — G4733 Obstructive sleep apnea (adult) (pediatric): Secondary | ICD-10-CM | POA: Diagnosis not present

## 2021-08-20 LAB — HM DIABETES EYE EXAM

## 2021-08-21 ENCOUNTER — Encounter: Payer: Self-pay | Admitting: Internal Medicine

## 2021-09-20 ENCOUNTER — Encounter: Payer: Self-pay | Admitting: Adult Health

## 2021-09-20 DIAGNOSIS — G4726 Circadian rhythm sleep disorder, shift work type: Secondary | ICD-10-CM

## 2021-09-20 DIAGNOSIS — G4719 Other hypersomnia: Secondary | ICD-10-CM

## 2021-09-24 NOTE — Telephone Encounter (Signed)
Per [place] registry, last filled on 10/13/2019  #30, prescribed by Dr Vickey Huger.

## 2021-09-28 ENCOUNTER — Other Ambulatory Visit: Payer: Self-pay | Admitting: Internal Medicine

## 2021-10-01 DIAGNOSIS — Z20822 Contact with and (suspected) exposure to covid-19: Secondary | ICD-10-CM | POA: Diagnosis not present

## 2021-10-01 NOTE — Telephone Encounter (Signed)
[place] drug registry unchanged. Prescription refill sent to [facility] to e-scribe.

## 2021-10-01 NOTE — Telephone Encounter (Signed)
yes

## 2021-10-01 NOTE — Addendum Note (Signed)
Addended by: Bertram Savin on: 10/01/2021 02:35 PM   Modules accepted: Orders

## 2021-10-02 MED ORDER — MODAFINIL 200 MG PO TABS: 200.0000 mg | ORAL_TABLET | Freq: Every day | ORAL | 0 refills | Status: DC | PRN

## 2021-10-18 ENCOUNTER — Encounter: Payer: Self-pay | Admitting: Internal Medicine

## 2021-10-18 ENCOUNTER — Other Ambulatory Visit: Payer: Self-pay

## 2021-10-18 ENCOUNTER — Ambulatory Visit (INDEPENDENT_AMBULATORY_CARE_PROVIDER_SITE_OTHER): Payer: BC Managed Care – PPO | Admitting: Internal Medicine

## 2021-10-18 VITALS — BP 120/86 | HR 83 | Temp 98.1°F | Ht 64.4 in | Wt 238.0 lb

## 2021-10-18 DIAGNOSIS — Z6841 Body Mass Index (BMI) 40.0 and over, adult: Secondary | ICD-10-CM

## 2021-10-18 DIAGNOSIS — E7800 Pure hypercholesterolemia, unspecified: Secondary | ICD-10-CM

## 2021-10-18 DIAGNOSIS — I10 Essential (primary) hypertension: Secondary | ICD-10-CM

## 2021-10-18 DIAGNOSIS — Z0000 Encounter for general adult medical examination without abnormal findings: Secondary | ICD-10-CM

## 2021-10-18 DIAGNOSIS — E785 Hyperlipidemia, unspecified: Secondary | ICD-10-CM | POA: Diagnosis not present

## 2021-10-18 DIAGNOSIS — E1165 Type 2 diabetes mellitus with hyperglycemia: Secondary | ICD-10-CM

## 2021-10-18 LAB — CBC
Hematocrit: 51.1 % — ABNORMAL HIGH (ref 37.5–51.0)
Hemoglobin: 16.2 g/dL (ref 13.0–17.7)
MCH: 25.2 pg — ABNORMAL LOW (ref 26.6–33.0)
MCHC: 31.7 g/dL (ref 31.5–35.7)
MCV: 79 fL (ref 79–97)
Platelets: 194 10*3/uL (ref 150–450)
RBC: 6.44 x10E6/uL — ABNORMAL HIGH (ref 4.14–5.80)
RDW: 13.8 % (ref 11.6–15.4)
WBC: 6.9 10*3/uL (ref 3.4–10.8)

## 2021-10-18 LAB — POCT URINALYSIS DIPSTICK
Bilirubin, UA: NEGATIVE
Blood, UA: NEGATIVE
Glucose, UA: NEGATIVE
Ketones, UA: NEGATIVE
Leukocytes, UA: NEGATIVE
Nitrite, UA: NEGATIVE
Protein, UA: NEGATIVE
Spec Grav, UA: 1.025 (ref 1.010–1.025)
Urobilinogen, UA: 1 E.U./dL
pH, UA: 6 (ref 5.0–8.0)

## 2021-10-18 LAB — LIPID PANEL
Chol/HDL Ratio: 3.8 ratio (ref 0.0–5.0)
Cholesterol, Total: 167 mg/dL (ref 100–199)
HDL: 44 mg/dL (ref >39)
LDL Chol Calc (NIH): 97 mg/dL (ref 0–99)
Triglycerides: 151 mg/dL — ABNORMAL HIGH (ref 0–149)
VLDL Cholesterol Cal: 26 mg/dL (ref 5–40)

## 2021-10-18 LAB — POCT UA - MICROALBUMIN
Albumin/Creatinine Ratio, Urine, POC: <30
Creatinine, POC: 300 mg/dL
Microalbumin Ur, POC: 30 mg/L

## 2021-10-18 LAB — HEMOGLOBIN A1C
Est. average glucose Bld gHb Est-mCnc: 131 mg/dL
Hgb A1c MFr Bld: 6.2 % — ABNORMAL HIGH (ref 4.8–5.6)

## 2021-10-18 MED ORDER — AMLODIPINE BESYLATE 10 MG PO TABS: ORAL_TABLET | ORAL | 1 refills | Status: DC

## 2021-10-18 MED ORDER — METOPROLOL SUCCINATE ER 25 MG PO TB24: ORAL_TABLET | ORAL | 1 refills | Status: DC

## 2021-10-18 MED ORDER — CHLORTHALIDONE 25 MG PO TABS: 25.0000 mg | ORAL_TABLET | Freq: Every day | ORAL | 1 refills | Status: DC

## 2021-10-18 NOTE — Patient Instructions (Signed)
Health Maintenance, Male Adopting a healthy lifestyle and getting preventive care are important in promoting health and wellness. Ask your health care provider about: The right schedule for you to have regular tests and exams. Things you can do on your own to prevent diseases and keep yourself healthy. What should I know about diet, weight, and exercise? Eat a healthy diet  Eat a diet that includes plenty of vegetables, fruits, low-fat dairy products, and lean protein. Do not eat a lot of foods that are high in solid fats, added sugars, or sodium. Maintain a healthy weight Body mass index (BMI) is a measurement that can be used to identify possible weight problems. It estimates body fat based on height and weight. Your health care provider can help determine your BMI and help you achieve or maintain a healthy weight. Get regular exercise Get regular exercise. This is one of the most important things you can do for your health. Most adults should: Exercise for at least 150 minutes each week. The exercise should increase your heart rate and make you sweat (moderate-intensity exercise). Do strengthening exercises at least twice a week. This is in addition to the moderate-intensity exercise. Spend less time sitting. Even light physical activity can be beneficial. Watch cholesterol and blood lipids Have your blood tested for lipids and cholesterol at 42 years of age, then have this test every 5 years. You may need to have your cholesterol levels checked more often if: Your lipid or cholesterol levels are high. You are older than 42 years of age. You are at high risk for heart disease. What should I know about cancer screening? Many types of cancers can be detected early and may often be prevented. Depending on your health history and family history, you may need to have cancer screening at various ages. This may include screening for: Colorectal cancer. Prostate cancer. Skin cancer. Lung  cancer. What should I know about heart disease, diabetes, and high blood pressure? Blood pressure and heart disease High blood pressure causes heart disease and increases the risk of stroke. This is more likely to develop in people who have high blood pressure readings, are of African descent, or are overweight. Talk with your health care provider about your target blood pressure readings. Have your blood pressure checked: Every 3-5 years if you are 18-39 years of age. Every year if you are 40 years old or older. If you are between the ages of 65 and 75 and are a current or former smoker, ask your health care provider if you should have a one-time screening for abdominal aortic aneurysm (AAA). Diabetes Have regular diabetes screenings. This checks your fasting blood sugar level. Have the screening done: Once every three years after age 45 if you are at a normal weight and have a low risk for diabetes. More often and at a younger age if you are overweight or have a high risk for diabetes. What should I know about preventing infection? Hepatitis B If you have a higher risk for hepatitis B, you should be screened for this virus. Talk with your health care provider to find out if you are at risk for hepatitis B infection. Hepatitis C Blood testing is recommended for: Everyone born from 1945 through 1965. Anyone with known risk factors for hepatitis C. Sexually transmitted infections (STIs) You should be screened each year for STIs, including gonorrhea and chlamydia, if: You are sexually active and are younger than 42 years of age. You are older than 42 years   of age and your health care provider tells you that you are at risk for this type of infection. Your sexual activity has changed since you were last screened, and you are at increased risk for chlamydia or gonorrhea. Ask your health care provider if you are at risk. Ask your health care provider about whether you are at high risk for HIV.  Your health care provider may recommend a prescription medicine to help prevent HIV infection. If you choose to take medicine to prevent HIV, you should first get tested for HIV. You should then be tested every 3 months for as long as you are taking the medicine. Follow these instructions at home: Lifestyle Do not use any products that contain nicotine or tobacco, such as cigarettes, e-cigarettes, and chewing tobacco. If you need help quitting, ask your health care provider. Do not use street drugs. Do not share needles. Ask your health care provider for help if you need support or information about quitting drugs. Alcohol use Do not drink alcohol if your health care provider tells you not to drink. If you drink alcohol: Limit how much you have to 0-2 drinks a day. Be aware of how much alcohol is in your drink. In the [place] one drink equals one 12 oz bottle of beer (355 mL), one 5 oz glass of wine (148 mL), or one 1 oz glass of hard liquor (44 mL). General instructions Schedule regular health, dental, and eye exams. Stay current with your vaccines. Tell your health care provider if: You often feel depressed. You have ever been abused or do not feel safe at home. Summary Adopting a healthy lifestyle and getting preventive care are important in promoting health and wellness. Follow your health care provider's instructions about healthy diet, exercising, and getting tested or screened for diseases. Follow your health care provider's instructions on monitoring your cholesterol and blood pressure. This information is not intended to replace advice given to you by your health care provider. Make sure you discuss any questions you have with your health care provider. Document Revised: 02/23/2021 Document Reviewed: 12/09/2018 Elsevier Patient Education  2022 [employer]  

## 2021-10-18 NOTE — Progress Notes (Signed)
This visit occurred during the SARS-CoV-2 public health emergency.  Safety protocols were in place, including screening questions prior to the visit, additional usage of staff PPE, and extensive cleaning of exam room while observing appropriate contact time as indicated for disinfecting solutions.  Subjective:     Patient ID: Austin [name] , male    DOB: December 20, 1981 , 40 y.o.   MRN: [id number]   Chief Complaint  Patient presents with   Annual Exam   Hypertension   Diabetes     HPI  He is here today for a full physical exam. He has no specific concerns or complaints at this time. He reports compliance with meds. He denies headaches, chest pain and shortness of breath.   Hypertension This is a chronic problem. The current episode started more than 1 year ago. The problem has been gradually improving since onset. The problem is controlled. Pertinent negatives include no blurred vision, chest pain, palpitations or shortness of breath. Risk factors for coronary artery disease include sedentary lifestyle, male gender and obesity. Past treatments include beta blockers, calcium channel blockers and diuretics. The current treatment provides moderate improvement. Compliance problems include exercise.   Diabetes He presents for his follow-up diabetic visit. He has type 2 diabetes mellitus. There are no hypoglycemic associated symptoms. Pertinent negatives for diabetes include no blurred vision and no chest pain. There are no hypoglycemic complications. Risk factors for coronary artery disease include diabetes mellitus, hypertension, male sex and obesity. An ACE inhibitor/angiotensin II receptor blocker is being taken.    Past Medical History:  Diagnosis Date   COVID-19    Essential hypertension 09/18/2017   Hypertension      Family History  Problem Relation Age of Onset   Diabetes Mother    Healthy Sister    Healthy Brother    Hypertension Maternal Grandmother    Stroke Maternal Grandmother     Multiple sclerosis Father      Current Outpatient Medications:    clobetasol ointment (TEMOVATE) 0.05 %, Apply to worst areas of hand  twice daily. NEVER to face/groin, Disp: , Rfl:    irbesartan (AVAPRO) 300 MG tablet, TAKE 1 TABLET BY MOUTH EVERY DAY, Disp: 90 tablet, Rfl: 2   loratadine (CLARITIN) 10 MG tablet, Take 10 mg by mouth daily., Disp: , Rfl:    MAGNESIUM CITRATE PO, Take 500 mg by mouth at bedtime. , Disp: , Rfl:    modafinil (PROVIGIL) 200 MG tablet, Take 1 tablet (200 mg total) by mouth daily as needed., Disp: 30 tablet, Rfl: 0   amLODipine (NORVASC) 10 MG tablet, One tab po qd, Disp: 90 tablet, Rfl: 1   chlorthalidone (HYGROTON) 25 MG tablet, Take 1 tablet (25 mg total) by mouth daily., Disp: 90 tablet, Rfl: 1   metoprolol succinate (TOPROL-XL) 25 MG 24 hr tablet, TAKE 1 TABLET(25 MG) BY MOUTH AT BEDTIME, Disp: 90 tablet, Rfl: 1   OZEMPIC, 1 MG/DOSE, 4 MG/3ML SOPN, INJECT 1 MG UNDER THE SKIN ONCE A WEEK, Disp: 3 mL, Rfl: 3   No Known Allergies   Men's preventive visit. Patient Health Questionnaire (PHQ-2) is  [place] Office Visit from 10/18/2021 in [facility]  PHQ-2 Total Score 0     . Patient is on a healthy diet. Marital status: Married. Relevant history for alcohol use is:  Social History   Substance and Sexual Activity  Alcohol Use Yes   Alcohol/week: 2.0 standard drinks   Types: 2 Standard drinks or equivalent per week  .  Relevant history for tobacco use is:  Social History   Tobacco Use  Smoking Status Never  Smokeless Tobacco Never  .   Review of Systems  Constitutional: Negative.   HENT: Negative.    Eyes: Negative.  Negative for blurred vision.  Respiratory: Negative.  Negative for shortness of breath.   Cardiovascular: Negative.  Negative for chest pain and palpitations.  Gastrointestinal: Negative.   Endocrine: Negative.   Genitourinary: Negative.   Musculoskeletal: Negative.   Skin: Negative.    Allergic/Immunologic: Negative.   Neurological: Negative.   Hematological: Negative.   Psychiatric/Behavioral: Negative.      Today's Vitals   10/18/21 0915  BP: 120/86  Pulse: 83  Temp: 98.1 F (36.7 C)  Weight: 238 lb (108 kg)  Height: 5' 4.4" (1.636 m)   Body mass index is 40.35 kg/m.  Wt Readings from Last 3 Encounters:  10/18/21 238 lb (108 kg)  07/05/21 236 lb 6.4 oz (107.2 kg)  02/26/21 245 lb 6.4 oz (111.3 kg)    BP Readings from Last 3 Encounters:  10/18/21 120/86  07/05/21 120/78  02/26/21 (!) 138/92    Objective:  Physical Exam Vitals and nursing note reviewed.  Constitutional:      Appearance: Normal appearance. He is obese.  HENT:     Head: Normocephalic and atraumatic.     Right Ear: Tympanic membrane, ear canal and external ear normal.     Left Ear: Tympanic membrane, ear canal and external ear normal.     Nose:     Comments: Masked     Mouth/Throat:     Comments: Masked  Eyes:     Extraocular Movements: Extraocular movements intact.     Conjunctiva/sclera: Conjunctivae normal.     Pupils: Pupils are equal, round, and reactive to light.  Cardiovascular:     Rate and Rhythm: Normal rate and regular rhythm.     Pulses: Normal pulses.          Dorsalis pedis pulses are 2+ on the right side and 2+ on the left side.     Heart sounds: Normal heart sounds.  Pulmonary:     Effort: Pulmonary effort is normal.     Breath sounds: Normal breath sounds.  Chest:  Breasts:    Right: Normal. No swelling, bleeding, inverted nipple, mass or nipple discharge.     Left: Normal. No swelling, bleeding, inverted nipple, mass or nipple discharge.  Abdominal:     General: Abdomen is flat. Bowel sounds are normal.     Palpations: Abdomen is soft.  Genitourinary:    Comments: Deferred  Musculoskeletal:        General: Normal range of motion.     Cervical back: Normal range of motion and neck supple.  Feet:     Right foot:     Protective Sensation: 5 sites  tested.  5 sites sensed.     Skin integrity: Dry skin present.     Toenail Condition: Right toenails are normal.     Left foot:     Protective Sensation: 5 sites tested.  5 sites sensed.     Skin integrity: Dry skin present.     Toenail Condition: Left toenails are normal.  Skin:    General: Skin is warm.  Neurological:     General: No focal deficit present.     Mental Status: He is alert and oriented to person, place, and time.  Psychiatric:        Mood and Affect: Mood normal.  Behavior: Behavior normal.        Assessment And Plan:    1. Encounter for annual physical exam Comments: A full exam was performed. DRE deferred, per patient. PATIENT IS ADVISED TO GET 30-45 MINUTES REGULAR EXERCISE NO LESS THAN FOUR TO FIVE DAYS PER WEEK - BOTH WEIGHTBEARING EXERCISES AND AEROBIC ARE RECOMMENDED.  PATIENT IS ADVISED TO FOLLOW A HEALTHY DIET WITH AT LEAST SIX FRUITS/VEGGIES PER DAY, DECREASE INTAKE OF RED MEAT, AND TO INCREASE FISH INTAKE TO TWO DAYS PER WEEK.  MEATS/FISH SHOULD NOT BE FRIED, BAKED OR BROILED IS PREFERABLE.  IT IS ALSO IMPORTANT TO CUT BACK ON YOUR SUGAR INTAKE. PLEASE AVOID ANYTHING WITH ADDED SUGAR, CORN SYRUP OR OTHER SWEETENERS. IF YOU MUST USE A SWEETENER, YOU CAN TRY STEVIA. IT IS ALSO IMPORTANT TO AVOID ARTIFICIALLY SWEETENERS AND DIET BEVERAGES. LASTLY, I SUGGEST WEARING SPF 50 SUNSCREEN ON EXPOSED PARTS AND ESPECIALLY WHEN IN THE DIRECT SUNLIGHT FOR AN EXTENDED PERIOD OF TIME.  PLEASE AVOID FAST FOOD RESTAURANTS AND INCREASE YOUR WATER INTAKE. - CBC - Hemoglobin A1c - CMP14+EGFR - Lipid panel  2. Essential hypertension, benign Comments: Chronic, well controlled. No med changes today. EKG performed, NSR w/ diffuse nonspecific T abnormality. He is encouraged to follow low sodium diet. He agrees to rto in 4-6 months for re-evaluation.  - EKG 12-Lead  3. Uncontrolled type 2 diabetes mellitus with hyperglycemia ([place]) Comments: Diabetic foot exam was performed. He  was also given [employer]. I DISCUSSED WITH THE PATIENT AT LENGTH REGARDING THE GOALS OF GLYCEMIC CONTROL AND POSSIBLE LONG-TERM COMPLICATIONS.  I  ALSO STRESSED THE IMPORTANCE OF COMPLIANCE WITH HOME GLUCOSE MONITORING, DIETARY RESTRICTIONS INCLUDING AVOIDANCE OF SUGARY DRINKS/PROCESSED FOODS,  ALONG WITH REGULAR EXERCISE.  I  ALSO STRESSED THE IMPORTANCE OF ANNUAL EYE EXAMS, SELF FOOT CARE AND COMPLIANCE WITH OFFICE VISITS. - POCT Urinalysis Dipstick (81002) - POCT UA - Microalbumin  4. Class 3 severe obesity due to excess calories with serious comorbidity and body mass index (BMI) of 40.0 to 44.9 in adult [facility]) Comments: BMI 40. He is encouraged to initially strive for BMI less than 35 to decrease cardiac risk. He is advised to exercise no less than 150 minutes per week.    Patient was given opportunity to ask questions. Patient verbalized understanding of the plan and was able to repeat key elements of the plan. All questions were answered to their satisfaction.   I, [name], MD, have reviewed all documentation for this visit. The documentation on 10/18/21 for the exam, diagnosis, procedures, and orders are all accurate and complete.   THE PATIENT IS ENCOURAGED TO PRACTICE SOCIAL DISTANCING DUE TO THE COVID-19 PANDEMIC.

## 2021-10-20 DIAGNOSIS — E1165 Type 2 diabetes mellitus with hyperglycemia: Secondary | ICD-10-CM | POA: Insufficient documentation

## 2021-10-20 DIAGNOSIS — E7800 Pure hypercholesterolemia, unspecified: Secondary | ICD-10-CM | POA: Insufficient documentation

## 2021-10-23 LAB — SPECIMEN STATUS REPORT

## 2021-11-03 ENCOUNTER — Other Ambulatory Visit: Payer: Self-pay | Admitting: Internal Medicine

## 2021-11-13 DIAGNOSIS — G4733 Obstructive sleep apnea (adult) (pediatric): Secondary | ICD-10-CM | POA: Diagnosis not present

## 2021-12-13 DIAGNOSIS — G4733 Obstructive sleep apnea (adult) (pediatric): Secondary | ICD-10-CM | POA: Diagnosis not present

## 2022-01-13 DIAGNOSIS — G4733 Obstructive sleep apnea (adult) (pediatric): Secondary | ICD-10-CM | POA: Diagnosis not present

## 2022-02-11 DIAGNOSIS — G4733 Obstructive sleep apnea (adult) (pediatric): Secondary | ICD-10-CM | POA: Diagnosis not present

## 2022-02-13 DIAGNOSIS — G4733 Obstructive sleep apnea (adult) (pediatric): Secondary | ICD-10-CM | POA: Diagnosis not present

## 2022-02-17 ENCOUNTER — Encounter: Payer: Self-pay | Admitting: Internal Medicine

## 2022-02-18 ENCOUNTER — Other Ambulatory Visit: Payer: Self-pay

## 2022-02-18 ENCOUNTER — Ambulatory Visit: Payer: BC Managed Care – PPO | Admitting: Internal Medicine

## 2022-02-18 ENCOUNTER — Encounter: Payer: Self-pay | Admitting: Internal Medicine

## 2022-02-18 VITALS — BP 144/90 | HR 84 | Temp 98.1°F | Ht 64.4 in | Wt 241.6 lb

## 2022-02-18 DIAGNOSIS — Z2821 Immunization not carried out because of patient refusal: Secondary | ICD-10-CM

## 2022-02-18 DIAGNOSIS — K921 Melena: Secondary | ICD-10-CM

## 2022-02-18 DIAGNOSIS — I10 Essential (primary) hypertension: Secondary | ICD-10-CM

## 2022-02-18 DIAGNOSIS — R42 Dizziness and giddiness: Secondary | ICD-10-CM

## 2022-02-18 DIAGNOSIS — E1165 Type 2 diabetes mellitus with hyperglycemia: Secondary | ICD-10-CM | POA: Diagnosis not present

## 2022-02-18 DIAGNOSIS — Z6841 Body Mass Index (BMI) 40.0 and over, adult: Secondary | ICD-10-CM

## 2022-02-18 LAB — POC HEMOCCULT BLD/STL (OFFICE/1-CARD/DIAGNOSTIC)
Card #1 Date: 2202023
Fecal Occult Blood, POC: POSITIVE — AB

## 2022-02-18 NOTE — Patient Instructions (Signed)

## 2022-02-18 NOTE — Progress Notes (Signed)
Austin [name],acting as a Education administrator for Austin Greenland, MD.,have documented all relevant documentation on the behalf of Austin Greenland, MD,as directed by  Austin Greenland, MD while in the presence of Austin Greenland, MD.  This visit occurred during the SARS-CoV-2 public health emergency.  Safety protocols were in place, including screening questions prior to the visit, additional usage of staff PPE, and extensive cleaning of exam room while observing appropriate contact time as indicated for disinfecting solutions.  Subjective:     Patient ID: Austin [name] , male    DOB: June 04, 1981 , 41 y.o.   MRN: [id number]   Chief Complaint  Patient presents with   Diabetes   Hypertension    HPI  Patient is here for a f/u on his diabetes and blood pressure. He reports compliance with meds. He denies headaches, chest pain and shortness of breath.      Diabetes He presents for his follow-up diabetic visit. He has type 2 diabetes mellitus. Hypoglycemia symptoms include dizziness. Pertinent negatives for diabetes include no blurred vision, no chest pain, no polydipsia and no polyphagia. There are no hypoglycemic complications. Risk factors for coronary artery disease include diabetes mellitus, hypertension, male sex and obesity. An ACE inhibitor/angiotensin II receptor blocker is being taken.  Hypertension This is a chronic problem. The current episode started more than 1 year ago. The problem has been gradually improving since onset. The problem is controlled. Pertinent negatives include no blurred vision, chest pain, palpitations or shortness of breath. The current treatment provides moderate improvement. Compliance problems include exercise.     Past Medical History:  Diagnosis Date   COVID-19    Essential hypertension 09/18/2017   Hypertension      Family History  Problem Relation Age of Onset   Diabetes Mother    Healthy Sister    Healthy Brother    Hypertension Maternal Grandmother     Stroke Maternal Grandmother    Multiple sclerosis Father      Current Outpatient Medications:    amLODipine (NORVASC) 10 MG tablet, One tab po qd, Disp: 90 tablet, Rfl: 1   chlorthalidone (HYGROTON) 25 MG tablet, Take 1 tablet (25 mg total) by mouth daily., Disp: 90 tablet, Rfl: 1   clobetasol ointment (TEMOVATE) 0.05 %, Apply to worst areas of hand  twice daily. NEVER to face/groin, Disp: , Rfl:    irbesartan (AVAPRO) 300 MG tablet, TAKE 1 TABLET BY MOUTH EVERY DAY, Disp: 90 tablet, Rfl: 2   loratadine (CLARITIN) 10 MG tablet, Take 10 mg by mouth daily., Disp: , Rfl:    MAGNESIUM CITRATE PO, Take 500 mg by mouth at bedtime. , Disp: , Rfl:    metoprolol succinate (TOPROL-XL) 25 MG 24 hr tablet, TAKE 1 TABLET(25 MG) BY MOUTH AT BEDTIME, Disp: 90 tablet, Rfl: 1   OZEMPIC, 1 MG/DOSE, 4 MG/3ML SOPN, INJECT 1 MG UNDER THE SKIN ONCE A WEEK, Disp: 3 mL, Rfl: 3   modafinil (PROVIGIL) 200 MG tablet, Take 1 tablet (200 mg total) by mouth daily as needed. (Patient not taking: Reported on 02/18/2022), Disp: 30 tablet, Rfl: 0   No Known Allergies   Review of Systems  Constitutional: Negative.   Eyes: Negative.  Negative for blurred vision.  Respiratory: Negative.  Negative for shortness of breath.   Cardiovascular: Negative.  Negative for chest pain and palpitations.  Gastrointestinal:  Positive for blood in stool.       States he had bloody stool on Saturday. No associated  abdominal pain or constipation. Denies h/o of similar sx. Denies melena. Denies h/o PUD. Denies epigastric pain. He does admit to eating a red velvet cookie on Saturday.   Endocrine: Negative for polydipsia and polyphagia.  Skin: Negative.   Neurological:  Positive for dizziness.       He also had episode of dizziness over the weekend, when abruptly changing positions. He did not pass out, sx resolved quickly. No recurrence of sx.   Psychiatric/Behavioral: Negative.      Today's Vitals   02/18/22 1133  BP: (!) 144/90   Pulse: 84  Temp: 98.1 F (36.7 C)  Weight: 241 lb 9.6 oz (109.6 kg)  Height: 5' 4.4" (1.636 m)  PainSc: 0-No pain   Body mass index is 40.96 kg/m.  Wt Readings from Last 3 Encounters:  02/18/22 241 lb 9.6 oz (109.6 kg)  10/18/21 238 lb (108 kg)  07/05/21 236 lb 6.4 oz (107.2 kg)    BP Readings from Last 3 Encounters:  02/18/22 (!) 144/90  10/18/21 120/86  07/05/21 120/78     Objective:  Physical Exam Vitals and nursing note reviewed. Exam conducted with a chaperone present.  Constitutional:      Appearance: Normal appearance.  Cardiovascular:     Rate and Rhythm: Normal rate and regular rhythm.     Pulses:          Dorsalis pedis pulses are 2+ on the right side and 2+ on the left side.     Heart sounds: Normal heart sounds.  Pulmonary:     Effort: Pulmonary effort is normal.     Breath sounds: Normal breath sounds.  Genitourinary:    Rectum: Guaiac result positive.  Musculoskeletal:     Cervical back: Normal range of motion.  Feet:     Right foot:     Protective Sensation: 5 sites tested.  5 sites sensed.     Skin integrity: Callus and dry skin present.     Toenail Condition: Right toenails are normal.     Left foot:     Protective Sensation: 5 sites tested.  5 sites sensed.     Skin integrity: Callus and dry skin present.     Toenail Condition: Left toenails are normal.  Skin:    General: Skin is warm.  Neurological:     General: No focal deficit present.     Mental Status: He is alert.  Psychiatric:        Mood and Affect: Mood normal.     Assessment And Plan:     1. Uncontrolled type 2 diabetes mellitus with hyperglycemia ([facility]) Comments: Chronic, I will check labs as below. I will adjust meds as needed. He will rto in 4 months for re-evaluation.  - CMP14+EGFR - Hemoglobin A1c  2. Essential hypertension, benign Comments: Chronic, uncontrolled. No med changes today.   3. Bloody stools Comments: DRE revealed maroon colored stool, guiaic positive. I  will check CBC and refer him to GI for further evaluation. I will start pantoprazole 13m daily.  - POC Hemoccult Bld/Stl (1-Cd Office Dx) - CBC no Diff - Ambulatory referral to Gastroenterology  4. Dizziness Comments: He is encouraged to stay well hydrated. He has not had anything orally today.   5. Class 3 severe obesity due to excess calories with serious comorbidity and body mass index (BMI) of 40.0 to 44.9 in adult ([facility] Comments: HE is aware of 3 lb weight gain since Oct 2022. He is encouraged to incorporate more exercise into  his daily routine.   6. Influenza vaccination declined   Patient was given opportunity to ask questions. Patient verbalized understanding of the plan and was able to repeat key elements of the plan. All questions were answered to their satisfaction.   I, Austin Greenland, MD, have reviewed all documentation for this visit. The documentation on 02/18/22 for the exam, diagnosis, procedures, and orders are all accurate and complete.   IF YOU HAVE BEEN REFERRED TO A SPECIALIST, IT MAY TAKE 1-2 WEEKS TO SCHEDULE/PROCESS THE REFERRAL. IF YOU HAVE NOT HEARD FROM US/SPECIALIST IN TWO WEEKS, PLEASE GIVE [place] A CALL AT [phone].   THE PATIENT IS ENCOURAGED TO PRACTICE SOCIAL DISTANCING DUE TO THE COVID-19 PANDEMIC.

## 2022-02-19 ENCOUNTER — Other Ambulatory Visit: Payer: Self-pay | Admitting: Internal Medicine

## 2022-02-19 ENCOUNTER — Encounter: Payer: Self-pay | Admitting: Internal Medicine

## 2022-02-19 ENCOUNTER — Other Ambulatory Visit: Payer: Self-pay

## 2022-02-19 LAB — CBC
Hematocrit: 47.1 % (ref 37.5–51.0)
Hemoglobin: 15 g/dL (ref 13.0–17.7)
MCH: 25.6 pg — ABNORMAL LOW (ref 26.6–33.0)
MCHC: 31.8 g/dL (ref 31.5–35.7)
MCV: 81 fL (ref 79–97)
Platelets: 184 10*3/uL (ref 150–450)
RBC: 5.85 x10E6/uL — ABNORMAL HIGH (ref 4.14–5.80)
RDW: 14.6 % (ref 11.6–15.4)
WBC: 6.3 10*3/uL (ref 3.4–10.8)

## 2022-02-19 LAB — CMP14+EGFR
ALT: 41 IU/L (ref 0–44)
AST: 26 IU/L (ref 0–40)
Albumin/Globulin Ratio: 2 (ref 1.2–2.2)
Albumin: 4.7 g/dL (ref 4.0–5.0)
Alkaline Phosphatase: 82 IU/L (ref 44–121)
BUN/Creatinine Ratio: 13 (ref 9–20)
BUN: 13 mg/dL (ref 6–24)
Bilirubin Total: 0.3 mg/dL (ref 0.0–1.2)
CO2: 26 mmol/L (ref 20–29)
Calcium: 9.6 mg/dL (ref 8.7–10.2)
Chloride: 100 mmol/L (ref 96–106)
Creatinine, Ser: 1.03 mg/dL (ref 0.76–1.27)
Globulin, Total: 2.4 g/dL (ref 1.5–4.5)
Glucose: 84 mg/dL (ref 70–99)
Potassium: 3.8 mmol/L (ref 3.5–5.2)
Sodium: 142 mmol/L (ref 134–144)
Total Protein: 7.1 g/dL (ref 6.0–8.5)
eGFR: 93 mL/min/{1.73_m2} (ref >59)

## 2022-02-19 LAB — HEMOGLOBIN A1C
Est. average glucose Bld gHb Est-mCnc: 146 mg/dL
Hgb A1c MFr Bld: 6.7 % — ABNORMAL HIGH (ref 4.8–5.6)

## 2022-02-19 MED ORDER — PANTOPRAZOLE SODIUM 40 MG PO TBEC: 40.0000 mg | DELAYED_RELEASE_TABLET | Freq: Every day | ORAL | 1 refills | Status: DC

## 2022-02-22 DIAGNOSIS — Z713 Dietary counseling and surveillance: Secondary | ICD-10-CM | POA: Diagnosis not present

## 2022-02-25 DIAGNOSIS — R195 Other fecal abnormalities: Secondary | ICD-10-CM | POA: Diagnosis not present

## 2022-02-25 DIAGNOSIS — R0683 Snoring: Secondary | ICD-10-CM | POA: Diagnosis not present

## 2022-02-25 DIAGNOSIS — K921 Melena: Secondary | ICD-10-CM | POA: Diagnosis not present

## 2022-02-26 DIAGNOSIS — Z713 Dietary counseling and surveillance: Secondary | ICD-10-CM | POA: Diagnosis not present

## 2022-03-11 DIAGNOSIS — G4733 Obstructive sleep apnea (adult) (pediatric): Secondary | ICD-10-CM | POA: Diagnosis not present

## 2022-03-12 DIAGNOSIS — R195 Other fecal abnormalities: Secondary | ICD-10-CM | POA: Diagnosis not present

## 2022-03-12 DIAGNOSIS — K5730 Diverticulosis of large intestine without perforation or abscess without bleeding: Secondary | ICD-10-CM | POA: Diagnosis not present

## 2022-03-12 DIAGNOSIS — K921 Melena: Secondary | ICD-10-CM | POA: Diagnosis not present

## 2022-03-12 LAB — HM COLONOSCOPY

## 2022-03-13 ENCOUNTER — Encounter: Payer: Self-pay | Admitting: Internal Medicine

## 2022-04-11 DIAGNOSIS — G4733 Obstructive sleep apnea (adult) (pediatric): Secondary | ICD-10-CM | POA: Diagnosis not present

## 2022-04-24 ENCOUNTER — Other Ambulatory Visit: Payer: Self-pay | Admitting: Internal Medicine

## 2022-04-24 DIAGNOSIS — I10 Essential (primary) hypertension: Secondary | ICD-10-CM

## 2022-05-01 ENCOUNTER — Telehealth: Payer: Self-pay

## 2022-05-01 NOTE — Telephone Encounter (Signed)
PA sent to plan for Ozempic 1mg . Awaiting determination.  ?

## 2022-05-01 NOTE — Telephone Encounter (Signed)
I called and left pt vm letting him know his PA for ozempic has been approved. YL,RMA ?

## 2022-05-07 ENCOUNTER — Other Ambulatory Visit: Payer: Self-pay | Admitting: Internal Medicine

## 2022-05-07 DIAGNOSIS — I10 Essential (primary) hypertension: Secondary | ICD-10-CM

## 2022-05-08 ENCOUNTER — Other Ambulatory Visit: Payer: Self-pay | Admitting: Internal Medicine

## 2022-05-08 DIAGNOSIS — I10 Essential (primary) hypertension: Secondary | ICD-10-CM

## 2022-05-13 DIAGNOSIS — G4733 Obstructive sleep apnea (adult) (pediatric): Secondary | ICD-10-CM | POA: Diagnosis not present

## 2022-05-13 DIAGNOSIS — K921 Melena: Secondary | ICD-10-CM | POA: Diagnosis not present

## 2022-06-13 DIAGNOSIS — G4733 Obstructive sleep apnea (adult) (pediatric): Secondary | ICD-10-CM | POA: Diagnosis not present

## 2022-07-03 ENCOUNTER — Encounter: Payer: Self-pay | Admitting: Internal Medicine

## 2022-07-03 ENCOUNTER — Ambulatory Visit (INDEPENDENT_AMBULATORY_CARE_PROVIDER_SITE_OTHER): Payer: BC Managed Care – PPO | Admitting: Internal Medicine

## 2022-07-03 VITALS — BP 132/80 | HR 79 | Temp 98.3°F | Ht 64.4 in | Wt 238.4 lb

## 2022-07-03 DIAGNOSIS — E1165 Type 2 diabetes mellitus with hyperglycemia: Secondary | ICD-10-CM

## 2022-07-03 DIAGNOSIS — E785 Hyperlipidemia, unspecified: Secondary | ICD-10-CM | POA: Diagnosis not present

## 2022-07-03 DIAGNOSIS — I10 Essential (primary) hypertension: Secondary | ICD-10-CM

## 2022-07-03 DIAGNOSIS — E7800 Pure hypercholesterolemia, unspecified: Secondary | ICD-10-CM

## 2022-07-03 DIAGNOSIS — Z6841 Body Mass Index (BMI) 40.0 and over, adult: Secondary | ICD-10-CM

## 2022-07-03 DIAGNOSIS — E1169 Type 2 diabetes mellitus with other specified complication: Secondary | ICD-10-CM

## 2022-07-03 LAB — BMP8+EGFR
BUN/Creatinine Ratio: 13 (ref 9–20)
BUN: 13 mg/dL (ref 6–24)
CO2: 23 mmol/L (ref 20–29)
Calcium: 9.3 mg/dL (ref 8.7–10.2)
Chloride: 104 mmol/L (ref 96–106)
Creatinine, Ser: 1.03 mg/dL (ref 0.76–1.27)
Glucose: 95 mg/dL (ref 70–99)
Potassium: 4.1 mmol/L (ref 3.5–5.2)
Sodium: 141 mmol/L (ref 134–144)
eGFR: 92 mL/min/{1.73_m2} (ref >59)

## 2022-07-03 LAB — HEMOGLOBIN A1C
Est. average glucose Bld gHb Est-mCnc: 126 mg/dL
Hgb A1c MFr Bld: 6 % — ABNORMAL HIGH (ref 4.8–5.6)

## 2022-07-03 MED ORDER — CHLORTHALIDONE 25 MG PO TABS: ORAL_TABLET | ORAL | 1 refills | Status: DC

## 2022-07-03 NOTE — Progress Notes (Signed)
[name],acting as a Education administrator for [name], MD.,have documented all relevant documentation on the behalf of [name], MD,as directed by  [name], MD while in the presence of [name], MD.  This visit occurred during the SARS-CoV-2 public health emergency.  Safety protocols were in place, including screening questions prior to the visit, additional usage of staff PPE, and extensive cleaning of exam room while observing appropriate contact time as indicated for disinfecting solutions.  Subjective:     Patient ID: Austin [name] , male    DOB: 1981-05-21 , 41 y.o.   MRN: [id number]   Chief Complaint  Patient presents with   Hypertension   Diabetes    HPI  Patient is here for a f/u on his diabetes and blood pressure. He reports compliance with meds. He denies headaches, chest pain and shortness of breath.      Hypertension This is a chronic problem. The current episode started more than 1 year ago. The problem has been gradually improving since onset. The problem is controlled. Pertinent negatives include no blurred vision, chest pain, palpitations or shortness of breath. The current treatment provides moderate improvement. Compliance problems include exercise.   Diabetes He presents for his follow-up diabetic visit. He has type 2 diabetes mellitus. Pertinent negatives for diabetes include no blurred vision, no chest pain, no polydipsia, no polyphagia and no polyuria. There are no hypoglycemic complications. Risk factors for coronary artery disease include diabetes mellitus, hypertension, male sex and obesity. An ACE inhibitor/angiotensin II receptor blocker is being taken.     Past Medical History:  Diagnosis Date   COVID-19    Essential hypertension 09/18/2017   Hypertension      Family History  Problem Relation Age of Onset   Diabetes Mother    Healthy Sister    Healthy Brother    Hypertension Maternal Grandmother    Stroke Maternal  Grandmother    Multiple sclerosis Father      Current Outpatient Medications:    amLODipine (NORVASC) 10 MG tablet, TAKE 1 TABLET(10 MG) BY MOUTH DAILY, Disp: 90 tablet, Rfl: 1   clobetasol ointment (TEMOVATE) 0.05 %, Apply to worst areas of hand  twice daily. NEVER to face/groin, Disp: , Rfl:    irbesartan (AVAPRO) 300 MG tablet, TAKE 1 TABLET BY MOUTH EVERY DAY, Disp: 90 tablet, Rfl: 2   loratadine (CLARITIN) 10 MG tablet, Take 10 mg by mouth daily., Disp: , Rfl:    MAGNESIUM CITRATE PO, Take 500 mg by mouth at bedtime. , Disp: , Rfl:    metoprolol succinate (TOPROL-XL) 25 MG 24 hr tablet, TAKE 1 TABLET(25 MG) BY MOUTH AT BEDTIME, Disp: 90 tablet, Rfl: 1   modafinil (PROVIGIL) 200 MG tablet, Take 1 tablet (200 mg total) by mouth daily as needed., Disp: 30 tablet, Rfl: 0   OZEMPIC, 1 MG/DOSE, 4 MG/3ML SOPN, INJECT 1 MG UNDER THE SKIN ONCE A WEEK, Disp: 3 mL, Rfl: 3   chlorthalidone (HYGROTON) 25 MG tablet, TAKE 1 TABLET(25 MG) BY MOUTH DAILY, Disp: 90 tablet, Rfl: 1   No Known Allergies   Review of Systems  Constitutional: Negative.   Eyes:  Negative for blurred vision.  Respiratory: Negative.  Negative for shortness of breath.   Cardiovascular: Negative.  Negative for chest pain and palpitations.  Gastrointestinal: Negative.   Endocrine: Negative for polydipsia, polyphagia and polyuria.  Neurological: Negative.   Psychiatric/Behavioral: Negative.       Today's Vitals   07/03/22  0853  BP: 132/80  Pulse: 79  Temp: 98.3 F (36.8 C)  Weight: 238 lb 6.4 oz (108.1 kg)  Height: 5' 4.4" (1.636 m)  PainSc: 0-No pain   Body mass index is 40.41 kg/m.  Wt Readings from Last 3 Encounters:  07/03/22 238 lb 6.4 oz (108.1 kg)  02/18/22 241 lb 9.6 oz (109.6 kg)  10/18/21 238 lb (108 kg)     Objective:  Physical Exam Vitals and nursing note reviewed.  Constitutional:      Appearance: Normal appearance. He is obese.  HENT:     Head: Normocephalic and atraumatic.  Eyes:      Extraocular Movements: Extraocular movements intact.  Cardiovascular:     Rate and Rhythm: Normal rate and regular rhythm.     Heart sounds: Normal heart sounds.  Pulmonary:     Effort: Pulmonary effort is normal.     Breath sounds: Normal breath sounds.  Musculoskeletal:     Cervical back: Normal range of motion.  Skin:    General: Skin is warm.  Neurological:     General: No focal deficit present.     Mental Status: He is alert.  Psychiatric:        Mood and Affect: Mood normal.         Assessment And Plan:     1. Dyslipidemia associated with type 2 diabetes mellitus ([place]) Comments: Chronic, LDL goal <70. He is encouraged to increase his daily activity, aiming for at least 150 minutes of exercise per week. He will f/u in 4 months.  - Hemoglobin A1c  2. Essential hypertension, benign Comments: Chronic, fair control. Goal LDL<130/80. Advised to follow low sodium diet.  He will c/w irbesartan, amlodipine and chlorthalidone.  - BMP8+EGFR - chlorthalidone (HYGROTON) 25 MG tablet; TAKE 1 TABLET(25 MG) BY MOUTH DAILY  Dispense: 90 tablet; Refill: 1  3. Class 3 severe obesity due to excess calories with serious comorbidity and body mass index (BMI) of 40.0 to 44.9 in adult [facility]) Comments: BMI 40. He is encouraged to initially strive for BMI<35 to decrease cardiac risk.    Patient was given opportunity to ask questions. Patient verbalized understanding of the plan and was able to repeat key elements of the plan. All questions were answered to their satisfaction.   I, [name], MD, have reviewed all documentation for this visit. The documentation on 07/03/22 for the exam, diagnosis, procedures, and orders are all accurate and complete.   IF YOU HAVE BEEN REFERRED TO A SPECIALIST, IT MAY TAKE 1-2 WEEKS TO SCHEDULE/PROCESS THE REFERRAL. IF YOU HAVE NOT HEARD FROM US/SPECIALIST IN TWO WEEKS, PLEASE GIVE [place] A CALL AT [phone].   THE PATIENT IS ENCOURAGED TO PRACTICE  SOCIAL DISTANCING DUE TO THE COVID-19 PANDEMIC.

## 2022-07-03 NOTE — Patient Instructions (Signed)

## 2022-07-13 DIAGNOSIS — G4733 Obstructive sleep apnea (adult) (pediatric): Secondary | ICD-10-CM | POA: Diagnosis not present

## 2022-08-12 ENCOUNTER — Encounter: Payer: Self-pay | Admitting: *Deleted

## 2022-08-12 NOTE — Progress Notes (Unsigned)
PATIENT: Austin [name] DOB: [birth year]  REASON FOR VISIT: follow up HISTORY FROM: patient PRIMARY NEUROLOGIST:   Virtual Visit via Video Note  I connected with Austin [name] on 08/13/22 at  1:30 PM EDT by a video enabled telemedicine application located remotely at [facility] and verified that I am speaking with the correct person using two identifiers who was located at their own home.   I discussed the limitations of evaluation and management by telemedicine and the availability of in person appointments. The patient expressed understanding and agreed to proceed.   PATIENT: Austin [name] DOB: Oct 31, 1981  REASON FOR VISIT: follow up HISTORY FROM: patient  HISTORY OF PRESENT ILLNESS: Today 08/13/22:  Mr. Austin [name] is a 41 year old male with a history of obstructive sleep apnea on CPAP.  He returns today for follow-up.  He reports that CPAP continues to work well for him.  He is a shift Financial controller.  He states that he does still use modafinil.  Reports that he does not have to use it on a daily basis typically only uses it if he does not sleep well and has to work.  Reports that sometimes he may only use it once a month.  He denies any new issues.  He returns today for an evaluation.    08/07/21: Mr. Austin [name] is a 41 year old male with a history of obstructive sleep apnea on CPAP.  He reports that the CPAP is working well for him.  He denies any new issues.  His download is below    HISTORY Mr. Austin [name] is an 41 year old male with a history of OSA on cpap. His download indicates that he used his machine 29/30 for a compliance of 97%. He used his machine >4 hours 13/30 for a compliance of 43%. On average he used his machine 4 hours 16 minutes. His residual AHI is 0.6 on 9 cm H20 with EPR 3.  His leak in the 95th percentile is 14.6 L/min.  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  For this reason some nights he has a hard time using it greater than 4 hours  REVIEW OF SYSTEMS: Out  of a complete 14 system review of symptoms, the patient complains only of the following symptoms, and all other reviewed systems are negative.  ALLERGIES: No Known Allergies  HOME MEDICATIONS: Outpatient Medications Prior to Visit  Medication Sig Dispense Refill   amLODipine (NORVASC) 10 MG tablet TAKE 1 TABLET(10 MG) BY MOUTH DAILY 90 tablet 1   chlorthalidone (HYGROTON) 25 MG tablet TAKE 1 TABLET(25 MG) BY MOUTH DAILY 90 tablet 1   clobetasol ointment (TEMOVATE) 0.05 % Apply to worst areas of hand  twice daily. NEVER to face/groin     irbesartan (AVAPRO) 300 MG tablet TAKE 1 TABLET BY MOUTH EVERY DAY 90 tablet 2   loratadine (CLARITIN) 10 MG tablet Take 10 mg by mouth daily.     MAGNESIUM CITRATE PO Take 500 mg by mouth at bedtime.      metoprolol succinate (TOPROL-XL) 25 MG 24 hr tablet TAKE 1 TABLET(25 MG) BY MOUTH AT BEDTIME 90 tablet 1   modafinil (PROVIGIL) 200 MG tablet Take 1 tablet (200 mg total) by mouth daily as needed. 30 tablet 0   OZEMPIC, 1 MG/DOSE, 4 MG/3ML SOPN INJECT 1 MG UNDER THE SKIN ONCE A WEEK 3 mL 3   No facility-administered medications prior to visit.    PAST MEDICAL HISTORY: Past Medical History:  Diagnosis Date  COVID-19    Essential hypertension 09/18/2017   Hypertension     PAST SURGICAL HISTORY: Past Surgical History:  Procedure Laterality Date   KNEE ARTHROSCOPY WITH ANTERIOR CRUCIATE LIGAMENT (ACL) REPAIR      FAMILY HISTORY: Family History  Problem Relation Age of Onset   Diabetes Mother    Healthy Sister    Healthy Brother    Hypertension Maternal Grandmother    Stroke Maternal Grandmother    Multiple sclerosis Father     SOCIAL HISTORY: Social History   Socioeconomic History   Marital status: Married    Spouse name: Not on file   Number of children: Not on file   Years of education: Not on file   Highest education level: Not on file  Occupational History   Not on file  Tobacco Use   Smoking status: Never   Smokeless  tobacco: Never  Vaping Use   Vaping Use: Never used  Substance and Sexual Activity   Alcohol use: Yes    Alcohol/week: 2.0 standard drinks of alcohol    Types: 2 Standard drinks or equivalent per week   Drug use: No   Sexual activity: Not on file  Other Topics Concern   Not on file  Social History Narrative   Not on file   Social Determinants of Health   Financial Resource Strain: Not on file  Food Insecurity: Not on file  Transportation Needs: Not on file  Physical Activity: Not on file  Stress: Not on file  Social Connections: Not on file  Intimate Partner Violence: Not on file      PHYSICAL EXAM Generalized: Well developed, in no acute distress   Neurological examination  Mentation: Alert oriented to time, place, history taking. Follows all commands speech and language fluent Cranial nerve II-XII:Extraocular movements were full. Facial symmetry noted.  Head turning and shoulder shrug  were normal and symmetric.  DIAGNOSTIC DATA (LABS, IMAGING, TESTING) - I reviewed patient records, labs, notes, testing and imaging myself where available.  Lab Results  Component Value Date   WBC 6.3 02/18/2022   HGB 15.0 02/18/2022   HCT 47.1 02/18/2022   MCV 81 02/18/2022   PLT 184 02/18/2022      Component Value Date/Time   NA 141 07/03/2022 0939   K 4.1 07/03/2022 0939   CL 104 07/03/2022 0939   CO2 23 07/03/2022 0939   GLUCOSE 95 07/03/2022 0939   GLUCOSE 189 (H) 01/25/2020 0719   BUN 13 07/03/2022 0939   CREATININE 1.03 07/03/2022 0939   CALCIUM 9.3 07/03/2022 0939   PROT 7.1 02/18/2022 1220   ALBUMIN 4.7 02/18/2022 1220   AST 26 02/18/2022 1220   ALT 41 02/18/2022 1220   ALKPHOS 82 02/18/2022 1220   BILITOT 0.3 02/18/2022 1220   GFRNONAA 99 10/05/2020 1147   GFRAA 114 10/05/2020 1147   Lab Results  Component Value Date   CHOL 167 10/18/2021   HDL 44 10/18/2021   LDLCALC 97 10/18/2021   TRIG 151 (H) 10/18/2021   CHOLHDL 3.8 10/18/2021   Lab Results   Component Value Date   HGBA1C 6.0 (H) 07/03/2022   No results found for: "VITAMINB12" Lab Results  Component Value Date   TSH 1.540 02/26/2021      ASSESSMENT AND PLAN 41 y.o. year old male  has a past medical history of COVID-19, Essential hypertension (09/18/2017), and Hypertension. here with:  OSA on CPAP  CPAP compliance good Residual AHI is good Encouraged patient to continue using CPAP  nightly and > 4 hours each night Refill modafinil 200 mg daily if needed F/U in 1 year or sooner if needed    Butch Penny, MSN, NP-C 08/13/2022, 1:30 PM [facility] [address], [address] [place], [place] [address] [phone]

## 2022-08-13 ENCOUNTER — Telehealth (INDEPENDENT_AMBULATORY_CARE_PROVIDER_SITE_OTHER): Payer: BC Managed Care – PPO | Admitting: Adult Health

## 2022-08-13 DIAGNOSIS — G4726 Circadian rhythm sleep disorder, shift work type: Secondary | ICD-10-CM | POA: Diagnosis not present

## 2022-08-13 DIAGNOSIS — G4719 Other hypersomnia: Secondary | ICD-10-CM

## 2022-08-13 MED ORDER — MODAFINIL 200 MG PO TABS: 200.0000 mg | ORAL_TABLET | Freq: Every day | ORAL | 0 refills | Status: AC | PRN

## 2022-08-26 ENCOUNTER — Other Ambulatory Visit: Payer: Self-pay | Admitting: Internal Medicine

## 2022-08-28 ENCOUNTER — Other Ambulatory Visit: Payer: BC Managed Care – PPO

## 2022-09-03 ENCOUNTER — Other Ambulatory Visit: Payer: BC Managed Care – PPO

## 2022-09-03 DIAGNOSIS — E1169 Type 2 diabetes mellitus with other specified complication: Secondary | ICD-10-CM

## 2022-09-03 DIAGNOSIS — E1165 Type 2 diabetes mellitus with hyperglycemia: Secondary | ICD-10-CM

## 2022-09-03 DIAGNOSIS — E785 Hyperlipidemia, unspecified: Secondary | ICD-10-CM | POA: Diagnosis not present

## 2022-09-03 LAB — ALT: ALT: 35 IU/L (ref 0–44)

## 2022-09-06 LAB — SPECIMEN STATUS REPORT

## 2022-09-06 LAB — LIPID PANEL
Chol/HDL Ratio: 3.4 ratio (ref 0.0–5.0)
Cholesterol, Total: 151 mg/dL (ref 100–199)
HDL: 45 mg/dL (ref >39)
LDL Chol Calc (NIH): 87 mg/dL (ref 0–99)
Triglycerides: 101 mg/dL (ref 0–149)
VLDL Cholesterol Cal: 19 mg/dL (ref 5–40)

## 2022-09-24 ENCOUNTER — Other Ambulatory Visit: Payer: Self-pay | Admitting: Internal Medicine

## 2022-09-24 MED ORDER — ATORVASTATIN CALCIUM 10 MG PO TABS: 10.0000 mg | ORAL_TABLET | Freq: Every day | ORAL | 11 refills | Status: DC

## 2022-09-30 DIAGNOSIS — Z7985 Long-term (current) use of injectable non-insulin antidiabetic drugs: Secondary | ICD-10-CM | POA: Diagnosis not present

## 2022-09-30 DIAGNOSIS — E119 Type 2 diabetes mellitus without complications: Secondary | ICD-10-CM | POA: Diagnosis not present

## 2022-09-30 DIAGNOSIS — H5213 Myopia, bilateral: Secondary | ICD-10-CM | POA: Diagnosis not present

## 2022-09-30 DIAGNOSIS — Z7984 Long term (current) use of oral hypoglycemic drugs: Secondary | ICD-10-CM | POA: Diagnosis not present

## 2022-09-30 LAB — HM DIABETES EYE EXAM

## 2022-10-28 ENCOUNTER — Encounter: Payer: BC Managed Care – PPO | Admitting: Internal Medicine

## 2022-10-28 NOTE — Progress Notes (Deleted)
Austin [name] Austin [name],acting as a Education administrator for Austin Greenland, MD.,have documented all relevant documentation on the behalf of Austin Greenland, MD,as directed by  Austin Greenland, MD while in the presence of Austin Greenland, MD.   Subjective:     Patient ID: Austin [name] , male    DOB: 1981/02/24 , 41 y.o.   MRN: [id number]   Chief Complaint  Patient presents with   Annual Exam    HPI  HPI   Past Medical History:  Diagnosis Date   COVID-19    Essential hypertension 09/18/2017   Hypertension      Family History  Problem Relation Age of Onset   Diabetes Mother    Healthy Sister    Healthy Brother    Hypertension Maternal Grandmother    Stroke Maternal Grandmother    Multiple sclerosis Father      Current Outpatient Medications:    amLODipine (NORVASC) 10 MG tablet, TAKE 1 TABLET(10 MG) BY MOUTH DAILY, Disp: 90 tablet, Rfl: 1   atorvastatin (LIPITOR) 10 MG tablet, Take 1 tablet (10 mg total) by mouth daily., Disp: 30 tablet, Rfl: 11   chlorthalidone (HYGROTON) 25 MG tablet, TAKE 1 TABLET(25 MG) BY MOUTH DAILY, Disp: 90 tablet, Rfl: 1   clobetasol ointment (TEMOVATE) 0.05 %, Apply to worst areas of hand  twice daily. NEVER to face/groin, Disp: , Rfl:    irbesartan (AVAPRO) 300 MG tablet, TAKE 1 TABLET BY MOUTH EVERY DAY, Disp: 90 tablet, Rfl: 2   loratadine (CLARITIN) 10 MG tablet, Take 10 mg by mouth daily., Disp: , Rfl:    MAGNESIUM CITRATE PO, Take 500 mg by mouth at bedtime. , Disp: , Rfl:    metoprolol succinate (TOPROL-XL) 25 MG 24 hr tablet, TAKE 1 TABLET(25 MG) BY MOUTH AT BEDTIME, Disp: 90 tablet, Rfl: 1   modafinil (PROVIGIL) 200 MG tablet, Take 1 tablet (200 mg total) by mouth daily as needed., Disp: 15 tablet, Rfl: 0   OZEMPIC, 1 MG/DOSE, 4 MG/3ML SOPN, INJECT 1 MG UNDER THE SKIN ONCE A WEEK, Disp: 3 mL, Rfl: 3   No Known Allergies   Men's preventive visit. Patient Health Questionnaire (PHQ-2) is  Austin [name] Office Visit from 10/18/2021 in [facility]  PHQ-2 Total Score 0     . Patient is on a *** diet. Marital status: Married. Relevant history for alcohol use is:  Social History   Substance and Sexual Activity  Alcohol Use Yes   Alcohol/week: 2.0 standard drinks of alcohol   Types: 2 Standard drinks or equivalent per week  . Relevant history for tobacco use is:  Social History   Tobacco Use  Smoking Status Never  Smokeless Tobacco Never  .   Review of Systems   There were no vitals filed for this visit. There is no height or weight on file to calculate BMI.   Objective:  Physical Exam      Assessment And Plan:    There are no diagnoses linked to this encounter.    Patient was given opportunity to ask questions. Patient verbalized understanding of the plan and was able to repeat key elements of the plan. All questions were answered to their satisfaction.   Austin [name], [place], Austin [name], CMA, have reviewed all documentation for this visit. The documentation on 10/28/22 for the exam, diagnosis, procedures, and orders are all accurate and complete.  THE PATIENT IS ENCOURAGED TO PRACTICE SOCIAL DISTANCING DUE TO  THE COVID-19 PANDEMIC.

## 2022-10-29 NOTE — Progress Notes (Signed)
Erroneous - no show

## 2022-12-13 DIAGNOSIS — G4733 Obstructive sleep apnea (adult) (pediatric): Secondary | ICD-10-CM | POA: Diagnosis not present

## 2022-12-15 ENCOUNTER — Other Ambulatory Visit: Payer: Self-pay | Admitting: Internal Medicine

## 2023-01-13 DIAGNOSIS — G4733 Obstructive sleep apnea (adult) (pediatric): Secondary | ICD-10-CM | POA: Diagnosis not present

## 2023-01-30 ENCOUNTER — Other Ambulatory Visit: Payer: Self-pay | Admitting: Internal Medicine

## 2023-02-13 DIAGNOSIS — G4733 Obstructive sleep apnea (adult) (pediatric): Secondary | ICD-10-CM | POA: Diagnosis not present

## 2023-02-18 DIAGNOSIS — R509 Fever, unspecified: Secondary | ICD-10-CM | POA: Diagnosis not present

## 2023-02-18 DIAGNOSIS — J069 Acute upper respiratory infection, unspecified: Secondary | ICD-10-CM | POA: Diagnosis not present

## 2023-03-10 ENCOUNTER — Telehealth: Payer: BC Managed Care – PPO | Admitting: Physician Assistant

## 2023-03-10 DIAGNOSIS — J40 Bronchitis, not specified as acute or chronic: Secondary | ICD-10-CM

## 2023-03-10 DIAGNOSIS — J019 Acute sinusitis, unspecified: Secondary | ICD-10-CM | POA: Diagnosis not present

## 2023-03-10 DIAGNOSIS — B9689 Other specified bacterial agents as the cause of diseases classified elsewhere: Secondary | ICD-10-CM | POA: Diagnosis not present

## 2023-03-10 MED ORDER — FLUTICASONE PROPIONATE 50 MCG/ACT NA SUSP: 2.0000 | Freq: Every day | NASAL | 0 refills | Status: DC

## 2023-03-10 MED ORDER — AMOXICILLIN-POT CLAVULANATE 875-125 MG PO TABS: 1.0000 | ORAL_TABLET | Freq: Two times a day (BID) | ORAL | 0 refills | Status: DC

## 2023-03-10 NOTE — Progress Notes (Signed)
Virtual Visit Consent   Austin [name], you are scheduled for a virtual visit with a [facility] provider today. Just as with appointments in the office, your consent must be obtained to participate. Your consent will be active for this visit and any virtual visit you may have with one of our providers in the next 365 days. If you have a MyChart account, a copy of this consent can be sent to you electronically.  As this is a virtual visit, video technology does not allow for your provider to perform a traditional examination. This may limit your provider's ability to fully assess your condition. If your provider identifies any concerns that need to be evaluated in person or the need to arrange testing (such as labs, EKG, etc.), we will make arrangements to do so. Although advances in technology are sophisticated, we cannot ensure that it will always work on either your end or our end. If the connection with a video visit is poor, the visit may have to be switched to a telephone visit. With either a video or telephone visit, we are not always able to ensure that we have a secure connection.  By engaging in this virtual visit, you consent to the provision of healthcare and authorize for your insurance to be billed (if applicable) for the services provided during this visit. Depending on your insurance coverage, you may receive a charge related to this service.  I need to obtain your verbal consent now. Are you willing to proceed with your visit today? Austin [name] has provided verbal consent on 03/10/2023 for a virtual visit (video or telephone). Mar Daring, PA-C  Date: 03/10/2023 8:08 AM  Virtual Visit via Video Note   I, Mar Daring, connected with  Austin [name]  ([id number], 1981/02/05) on 03/10/23 at  8:00 AM EDT by a video-enabled telemedicine application and verified that I am speaking with the correct person using two identifiers.  Location: Patient: Virtual Visit Location  Patient: Home Provider: Virtual Visit Location Provider: Home Office   I discussed the limitations of evaluation and management by telemedicine and the availability of in person appointments. The patient expressed understanding and agreed to proceed.    History of Present Illness: Austin [name] is a 42 y.o. who identifies as a male who was assigned male at birth, and is being seen today for cough and congestion.  HPI: Cough This is a new problem. The current episode started 1 to 4 weeks ago (seen at [facility] on 02/18/23 and diagnosed with viral URI). The problem has been gradually worsening. The problem occurs constantly. The cough is Non-productive. Associated symptoms include chills, a fever (initially now improved), nasal congestion, postnasal drip, rhinorrhea and a sore throat (initially, but now improved). Pertinent negatives include no ear congestion, ear pain, headaches, myalgias, shortness of breath or wheezing. The symptoms are aggravated by lying down. Treatments tried: mucinex, theraflu, dayquil, nyquil, zyrtec, Promethazine DM from UC. The treatment provided no relief. His past medical history is significant for bronchitis. There is no history of asthma.    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   Essential hypertension, benign 09/18/2017   Snoring 11/27/2016   Excessive daytime sleepiness 11/27/2016   OSA (obstructive sleep apnea) 11/27/2016   Class 3 severe obesity due to excess calories with  serious comorbidity and body mass index (BMI) of 40.0 to 44.9 in adult [facility]) 11/27/2016    Allergies: No Known Allergies Medications:  Current Outpatient Medications:    amoxicillin-clavulanate (AUGMENTIN) 875-125 MG tablet, Take 1 tablet by mouth 2 (two) times daily., Disp: 20 tablet, Rfl: 0    fluticasone (FLONASE) 50 MCG/ACT nasal spray, Place 2 sprays into both nostrils daily., Disp: 16 g, Rfl: 0   amLODipine (NORVASC) 10 MG tablet, TAKE 1 TABLET(10 MG) BY MOUTH DAILY, Disp: 90 tablet, Rfl: 1   atorvastatin (LIPITOR) 10 MG tablet, Take 1 tablet (10 mg total) by mouth daily., Disp: 30 tablet, Rfl: 11   chlorthalidone (HYGROTON) 25 MG tablet, TAKE 1 TABLET(25 MG) BY MOUTH DAILY, Disp: 90 tablet, Rfl: 1   clobetasol ointment (TEMOVATE) 0.05 %, Apply to worst areas of hand  twice daily. NEVER to face/groin, Disp: , Rfl:    irbesartan (AVAPRO) 300 MG tablet, TAKE 1 TABLET BY MOUTH EVERY DAY, Disp: 90 tablet, Rfl: 2   loratadine (CLARITIN) 10 MG tablet, Take 10 mg by mouth daily., Disp: , Rfl:    MAGNESIUM CITRATE PO, Take 500 mg by mouth at bedtime. , Disp: , Rfl:    metoprolol succinate (TOPROL-XL) 25 MG 24 hr tablet, TAKE 1 TABLET(25 MG) BY MOUTH AT BEDTIME, Disp: 90 tablet, Rfl: 1   modafinil (PROVIGIL) 200 MG tablet, Take 1 tablet (200 mg total) by mouth daily as needed., Disp: 15 tablet, Rfl: 0   OZEMPIC, 1 MG/DOSE, 4 MG/3ML SOPN, INJECT 1 MG UNDER THE SKIN ONCE WEEKLY, Disp: 3 mL, Rfl: 3  Observations/Objective: Patient is well-developed, well-nourished in no acute distress.  Resting comfortably at home.  Head is normocephalic, atraumatic.  No labored breathing.  Speech is clear and coherent with logical content.  Patient is alert and oriented at baseline.    Assessment and Plan: 1. Acute bacterial sinusitis - amoxicillin-clavulanate (AUGMENTIN) 875-125 MG tablet; Take 1 tablet by mouth 2 (two) times daily.  Dispense: 20 tablet; Refill: 0 - fluticasone (FLONASE) 50 MCG/ACT nasal spray; Place 2 sprays into both nostrils daily.  Dispense: 16 g; Refill: 0  2. Bronchitis - amoxicillin-clavulanate (AUGMENTIN) 875-125 MG tablet; Take 1 tablet by mouth 2 (two) times daily.  Dispense: 20 tablet; Refill: 0  - Worsening symptoms that have not responded to OTC medications.  - Will  give Augmentin - Flonase added - Continue allergy medications.  - Steam and humidifier can help - Stay well hydrated and get plenty of rest.  - Seek in person evaluation if no symptom improvement or if symptoms worsen   Follow Up Instructions: I discussed the assessment and treatment plan with the patient. The patient was provided an opportunity to ask questions and all were answered. The patient agreed with the plan and demonstrated an understanding of the instructions.  A copy of instructions were sent to the patient via MyChart unless otherwise noted below.    The patient was advised to call back or seek an in-person evaluation if the symptoms worsen or if the condition fails to improve as anticipated.  Time:  I spent 10 minutes with the patient via telehealth technology discussing the above problems/concerns.    Mar Daring, PA-C

## 2023-03-10 NOTE — Patient Instructions (Signed)
Austin [name], thank you for joining Mar Daring, PA-C for today's virtual visit.  While this provider is not your primary care provider (PCP), if your PCP is located in our provider database this encounter information will be shared with them immediately following your visit.   [facility] account gives you access to today's visit and all your visits, tests, and labs performed at [facility] " click here if you don't have a [facility] account or go to [email or web address]  Consent: (Patient) Austin [name] provided verbal consent for this virtual visit at the beginning of the encounter.  Current Medications:  Current Outpatient Medications:    amoxicillin-clavulanate (AUGMENTIN) 875-125 MG tablet, Take 1 tablet by mouth 2 (two) times daily., Disp: 20 tablet, Rfl: 0   fluticasone (FLONASE) 50 MCG/ACT nasal spray, Place 2 sprays into both nostrils daily., Disp: 16 g, Rfl: 0   amLODipine (NORVASC) 10 MG tablet, TAKE 1 TABLET(10 MG) BY MOUTH DAILY, Disp: 90 tablet, Rfl: 1   atorvastatin (LIPITOR) 10 MG tablet, Take 1 tablet (10 mg total) by mouth daily., Disp: 30 tablet, Rfl: 11   chlorthalidone (HYGROTON) 25 MG tablet, TAKE 1 TABLET(25 MG) BY MOUTH DAILY, Disp: 90 tablet, Rfl: 1   clobetasol ointment (TEMOVATE) 0.05 %, Apply to worst areas of hand  twice daily. NEVER to face/groin, Disp: , Rfl:    irbesartan (AVAPRO) 300 MG tablet, TAKE 1 TABLET BY MOUTH EVERY DAY, Disp: 90 tablet, Rfl: 2   loratadine (CLARITIN) 10 MG tablet, Take 10 mg by mouth daily., Disp: , Rfl:    MAGNESIUM CITRATE PO, Take 500 mg by mouth at bedtime. , Disp: , Rfl:    metoprolol succinate (TOPROL-XL) 25 MG 24 hr tablet, TAKE 1 TABLET(25 MG) BY MOUTH AT BEDTIME, Disp: 90 tablet, Rfl: 1   modafinil (PROVIGIL) 200 MG tablet, Take 1 tablet (200 mg total) by mouth daily as needed., Disp: 15 tablet, Rfl: 0   OZEMPIC, 1 MG/DOSE, 4 MG/3ML SOPN, INJECT 1 MG UNDER THE SKIN ONCE WEEKLY,  Disp: 3 mL, Rfl: 3   Medications ordered in this encounter:  Meds ordered this encounter  Medications   amoxicillin-clavulanate (AUGMENTIN) 875-125 MG tablet    Sig: Take 1 tablet by mouth 2 (two) times daily.    Dispense:  20 tablet    Refill:  0    Order Specific Question:   Supervising Provider    Answer:   Chase Picket [id number]   fluticasone (FLONASE) 50 MCG/ACT nasal spray    Sig: Place 2 sprays into both nostrils daily.    Dispense:  16 g    Refill:  0    Order Specific Question:   Supervising Provider    Answer:   Chase Picket [id number]     *If you need refills on other medications prior to your next appointment, please contact your pharmacy*  Follow-Up: Call back or seek an in-person evaluation if the symptoms worsen or if the condition fails to improve as anticipated.  [place] [phone]  Other Instructions  Sinus Infection, Adult A sinus infection, also called sinusitis, is inflammation of your sinuses. Sinuses are hollow spaces in the bones around your face. Your sinuses are located: Around your eyes. In the middle of your forehead. Behind your nose. In your cheekbones. Mucus normally drains out of your sinuses. When your nasal tissues become inflamed or swollen, mucus can become trapped or blocked. This allows bacteria, viruses, and fungi  to grow, which leads to infection. Most infections of the sinuses are caused by a virus. A sinus infection can develop quickly. It can last for up to 4 weeks (acute) or for more than 12 weeks (chronic). A sinus infection often develops after a cold. What are the causes? This condition is caused by anything that creates swelling in the sinuses or stops mucus from draining. This includes: Allergies. Asthma. Infection from bacteria or viruses. Deformities or blockages in your nose or sinuses. Abnormal growths in the nose (nasal polyps). Pollutants, such as chemicals or irritants in the  air. Infection from fungi. This is rare. What increases the risk? You are more likely to develop this condition if you: Have a weak body defense system (immune system). Do a lot of swimming or diving. Overuse nasal sprays. Smoke. What are the signs or symptoms? The main symptoms of this condition are pain and a feeling of pressure around the affected sinuses. Other symptoms include: Stuffy nose or congestion that makes it difficult to breathe through your nose. Thick yellow or greenish drainage from your nose. Tenderness, swelling, and warmth over the affected sinuses. A cough that may get worse at night. Decreased sense of smell and taste. Extra mucus that collects in the throat or the back of the nose (postnasal drip) causing a sore throat or bad breath. Tiredness (fatigue). Fever. How is this diagnosed? This condition is diagnosed based on: Your symptoms. Your medical history. A physical exam. Tests to find out if your condition is acute or chronic. This may include: Checking your nose for nasal polyps. Viewing your sinuses using a device that has a light (endoscope). Testing for allergies or bacteria. Imaging tests, such as an MRI or CT scan. In rare cases, a bone biopsy may be done to rule out more serious types of fungal sinus disease. How is this treated? Treatment for a sinus infection depends on the cause and whether your condition is chronic or acute. If caused by a virus, your symptoms should go away on their own within 10 days. You may be given medicines to relieve symptoms. They include: Medicines that shrink swollen nasal passages (decongestants). A spray that eases inflammation of the nostrils (topical intranasal corticosteroids). Rinses that help get rid of thick mucus in your nose (nasal saline washes). Medicines that treat allergies (antihistamines). Over-the-counter pain relievers. If caused by bacteria, your health care provider may recommend waiting to see if  your symptoms improve. Most bacterial infections will get better without antibiotic medicine. You may be given antibiotics if you have: A severe infection. A weak immune system. If caused by narrow nasal passages or nasal polyps, surgery may be needed. Follow these instructions at home: Medicines Take, use, or apply over-the-counter and prescription medicines only as told by your health care provider. These may include nasal sprays. If you were prescribed an antibiotic medicine, take it as told by your health care provider. Do not stop taking the antibiotic even if you start to feel better. Hydrate and humidify  Drink enough fluid to keep your urine pale yellow. Staying hydrated will help to thin your mucus. Use a cool mist humidifier to keep the humidity level in your home above 50%. Inhale steam for 10-15 minutes, 3-4 times a day, or as told by your health care provider. You can do this in the bathroom while a hot shower is running. Limit your exposure to cool or dry air. Rest Rest as much as possible. Sleep with your head raised (elevated).  Make sure you get enough sleep each night. General instructions  Apply a warm, moist washcloth to your face 3-4 times a day or as told by your health care provider. This will help with discomfort. Use nasal saline washes as often as told by your health care provider. Wash your hands often with soap and water to reduce your exposure to germs. If soap and water are not available, use hand sanitizer. Do not smoke. Avoid being around people who are smoking (secondhand smoke). Keep all follow-up visits. This is important. Contact a health care provider if: You have a fever. Your symptoms get worse. Your symptoms do not improve within 10 days. Get help right away if: You have a severe headache. You have persistent vomiting. You have severe pain or swelling around your face or eyes. You have vision problems. You develop confusion. Your neck is  stiff. You have trouble breathing. These symptoms may be an emergency. Get help right away. Call 911. Do not wait to see if the symptoms will go away. Do not drive yourself to the hospital. Summary A sinus infection is soreness and inflammation of your sinuses. Sinuses are hollow spaces in the bones around your face. This condition is caused by nasal tissues that become inflamed or swollen. The swelling traps or blocks the flow of mucus. This allows bacteria, viruses, and fungi to grow, which leads to infection. If you were prescribed an antibiotic medicine, take it as told by your health care provider. Do not stop taking the antibiotic even if you start to feel better. Keep all follow-up visits. This is important. This information is not intended to replace advice given to you by your health care provider. Make sure you discuss any questions you have with your health care provider. Document Revised: 11/20/2021 Document Reviewed: 11/20/2021 Elsevier Patient Education  [place].    If you have been instructed to have an in-person evaluation today at a local Urgent Care facility, please use the link below. It will take you to a list of all of our available [facility], including address, phone number and hours of operation. Please do not delay care.  [facility]  If you or a family member do not have a primary care provider, use the link below to schedule a visit and establish care. When you choose a [facility] primary care physician or advanced practice provider, you gain a long-term partner in health. Find a Primary Care Provider  Learn more about [facility]'s in-office and virtual care options: [facility]

## 2023-03-31 ENCOUNTER — Encounter: Payer: Self-pay | Admitting: Internal Medicine

## 2023-04-01 ENCOUNTER — Other Ambulatory Visit: Payer: Self-pay

## 2023-04-01 MED ORDER — IRBESARTAN 300 MG PO TABS: 300.0000 mg | ORAL_TABLET | Freq: Every day | ORAL | 2 refills | Status: DC

## 2023-04-12 ENCOUNTER — Other Ambulatory Visit: Payer: Self-pay | Admitting: Internal Medicine

## 2023-04-17 ENCOUNTER — Encounter: Payer: Self-pay | Admitting: Internal Medicine

## 2023-04-17 ENCOUNTER — Ambulatory Visit (INDEPENDENT_AMBULATORY_CARE_PROVIDER_SITE_OTHER): Payer: BC Managed Care – PPO | Admitting: Internal Medicine

## 2023-04-17 VITALS — BP 140/90 | HR 87 | Temp 98.4°F | Ht 64.0 in | Wt 241.0 lb

## 2023-04-17 DIAGNOSIS — Z0000 Encounter for general adult medical examination without abnormal findings: Secondary | ICD-10-CM

## 2023-04-17 DIAGNOSIS — I10 Essential (primary) hypertension: Secondary | ICD-10-CM | POA: Diagnosis not present

## 2023-04-17 DIAGNOSIS — E785 Hyperlipidemia, unspecified: Secondary | ICD-10-CM

## 2023-04-17 DIAGNOSIS — E66813 Obesity, class 3: Secondary | ICD-10-CM

## 2023-04-17 DIAGNOSIS — E1169 Type 2 diabetes mellitus with other specified complication: Secondary | ICD-10-CM | POA: Diagnosis not present

## 2023-04-17 DIAGNOSIS — Z6841 Body Mass Index (BMI) 40.0 and over, adult: Secondary | ICD-10-CM

## 2023-04-17 LAB — POCT URINALYSIS DIPSTICK
Bilirubin, UA: NEGATIVE
Blood, UA: NEGATIVE
Glucose, UA: NEGATIVE
Ketones, UA: NEGATIVE
Leukocytes, UA: NEGATIVE
Nitrite, UA: NEGATIVE
Protein, UA: NEGATIVE
Spec Grav, UA: 1.025 (ref 1.010–1.025)
Urobilinogen, UA: 0.2 E.U./dL
pH, UA: 6.5 (ref 5.0–8.0)

## 2023-04-17 NOTE — Patient Instructions (Signed)
Health Maintenance, Male Adopting a healthy lifestyle and getting preventive care are important in promoting health and wellness. Ask your health care provider about: The right schedule for you to have regular tests and exams. Things you can do on your own to prevent diseases and keep yourself healthy. What should I know about diet, weight, and exercise? Eat a healthy diet  Eat a diet that includes plenty of vegetables, fruits, low-fat dairy products, and lean protein. Do not eat a lot of foods that are high in solid fats, added sugars, or sodium. Maintain a healthy weight Body mass index (BMI) is a measurement that can be used to identify possible weight problems. It estimates body fat based on height and weight. Your health care provider can help determine your BMI and help you achieve or maintain a healthy weight. Get regular exercise Get regular exercise. This is one of the most important things you can do for your health. Most adults should: Exercise for at least 150 minutes each week. The exercise should increase your heart rate and make you sweat (moderate-intensity exercise). Do strengthening exercises at least twice a week. This is in addition to the moderate-intensity exercise. Spend less time sitting. Even light physical activity can be beneficial. Watch cholesterol and blood lipids Have your blood tested for lipids and cholesterol at 44 years of age, then have this test every 5 years. You may need to have your cholesterol levels checked more often if: Your lipid or cholesterol levels are high. You are older than 44 years of age. You are at high risk for heart disease. What should I know about cancer screening? Many types of cancers can be detected early and may often be prevented. Depending on your health history and family history, you may need to have cancer screening at various ages. This may include screening for: Colorectal cancer. Prostate cancer. Skin cancer. Lung  cancer. What should I know about heart disease, diabetes, and high blood pressure? Blood pressure and heart disease High blood pressure causes heart disease and increases the risk of stroke. This is more likely to develop in people who have high blood pressure readings or are overweight. Talk with your health care provider about your target blood pressure readings. Have your blood pressure checked: Every 3-5 years if you are 18-39 years of age. Every year if you are 40 years old or older. If you are between the ages of 65 and 75 and are a current or former smoker, ask your health care provider if you should have a one-time screening for abdominal aortic aneurysm (AAA). Diabetes Have regular diabetes screenings. This checks your fasting blood sugar level. Have the screening done: Once every three years after age 45 if you are at a normal weight and have a low risk for diabetes. More often and at a younger age if you are overweight or have a high risk for diabetes. What should I know about preventing infection? Hepatitis B If you have a higher risk for hepatitis B, you should be screened for this virus. Talk with your health care provider to find out if you are at risk for hepatitis B infection. Hepatitis C Blood testing is recommended for: Everyone born from 1945 through 1965. Anyone with known risk factors for hepatitis C. Sexually transmitted infections (STIs) You should be screened each year for STIs, including gonorrhea and chlamydia, if: You are sexually active and are younger than 44 years of age. You are older than 44 years of age and your   health care provider tells you that you are at risk for this type of infection. Your sexual activity has changed since you were last screened, and you are at increased risk for chlamydia or gonorrhea. Ask your health care provider if you are at risk. Ask your health care provider about whether you are at high risk for HIV. Your health care provider  may recommend a prescription medicine to help prevent HIV infection. If you choose to take medicine to prevent HIV, you should first get tested for HIV. You should then be tested every 3 months for as long as you are taking the medicine. Follow these instructions at home: Alcohol use Do not drink alcohol if your health care provider tells you not to drink. If you drink alcohol: Limit how much you have to 0-2 drinks a day. Know how much alcohol is in your drink. In the [place] one drink equals one 12 oz bottle of beer (355 mL), one 5 oz glass of wine (148 mL), or one 1 oz glass of hard liquor (44 mL). Lifestyle Do not use any products that contain nicotine or tobacco. These products include cigarettes, chewing tobacco, and vaping devices, such as e-cigarettes. If you need help quitting, ask your health care provider. Do not use street drugs. Do not share needles. Ask your health care provider for help if you need support or information about quitting drugs. General instructions Schedule regular health, dental, and eye exams. Stay current with your vaccines. Tell your health care provider if: You often feel depressed. You have ever been abused or do not feel safe at home. Summary Adopting a healthy lifestyle and getting preventive care are important in promoting health and wellness. Follow your health care provider's instructions about healthy diet, exercising, and getting tested or screened for diseases. Follow your health care provider's instructions on monitoring your cholesterol and blood pressure. This information is not intended to replace advice given to you by your health care provider. Make sure you discuss any questions you have with your health care provider. Document Revised: 05/07/2021 Document Reviewed: 05/07/2021 Elsevier Patient Education  2023 [employer]  

## 2023-04-17 NOTE — Progress Notes (Addendum)
I,Victoria T Hamilton,acting as a scribe for Gwynneth Aliment, MD.,have documented all relevant documentation on the behalf of Gwynneth Aliment, MD,as directed by  Gwynneth Aliment, MD while in the presence of Gwynneth Aliment, MD.   Subjective:     Patient ID: Austin [name] , male    DOB: 24-Sep-1981 , 42 y.o.   MRN: [id number]   Chief Complaint  Patient presents with   Annual Exam   Diabetes   Hypertension   Hyperlipidemia    HPI  He is here today for a full physical exam. He has no specific concerns or complaints at this time. He reports compliance with meds. He denies headaches, chest pain and shortness of breath.    Diabetes He presents for his follow-up diabetic visit. He has type 2 diabetes mellitus. There are no hypoglycemic associated symptoms. Pertinent negatives for diabetes include no blurred vision and no chest pain. There are no hypoglycemic complications. Risk factors for coronary artery disease include diabetes mellitus, hypertension, male sex and obesity. An ACE inhibitor/angiotensin II receptor blocker is being taken.  Hypertension This is a chronic problem. The current episode started more than 1 year ago. The problem has been gradually improving since onset. The problem is controlled. Pertinent negatives include no blurred vision, chest pain, palpitations or shortness of breath. Risk factors for coronary artery disease include sedentary lifestyle, male gender and obesity. Past treatments include beta blockers, calcium channel blockers and diuretics. The current treatment provides moderate improvement. Compliance problems include exercise.   Hyperlipidemia The current episode started more than 1 year ago. Exacerbating diseases include diabetes and obesity. Pertinent negatives include no chest pain or shortness of breath. Current antihyperlipidemic treatment includes statins. The current treatment provides moderate improvement of lipids. Risk factors for coronary artery disease  include diabetes mellitus, dyslipidemia, male sex and hypertension.     Past Medical History:  Diagnosis Date   COVID-19    Essential hypertension 09/18/2017   Hypertension      Family History  Problem Relation Age of Onset   Diabetes Mother    Healthy Sister    Healthy Brother    Hypertension Maternal Grandmother    Stroke Maternal Grandmother    Multiple sclerosis Father      Current Outpatient Medications:    amLODipine (NORVASC) 10 MG tablet, TAKE 1 TABLET(10 MG) BY MOUTH DAILY, Disp: 90 tablet, Rfl: 1   atorvastatin (LIPITOR) 10 MG tablet, Take 1 tablet (10 mg total) by mouth daily., Disp: 30 tablet, Rfl: 11   chlorthalidone (HYGROTON) 25 MG tablet, TAKE 1 TABLET(25 MG) BY MOUTH DAILY, Disp: 90 tablet, Rfl: 1   clobetasol ointment (TEMOVATE) 0.05 %, Apply to worst areas of hand  twice daily. NEVER to face/groin, Disp: , Rfl:    fluticasone (FLONASE) 50 MCG/ACT nasal spray, Place 2 sprays into both nostrils daily., Disp: 16 g, Rfl: 0   irbesartan (AVAPRO) 300 MG tablet, Take 1 tablet (300 mg total) by mouth daily., Disp: 90 tablet, Rfl: 2   loratadine (CLARITIN) 10 MG tablet, Take 10 mg by mouth daily., Disp: , Rfl:    MAGNESIUM CITRATE PO, Take 500 mg by mouth at bedtime. , Disp: , Rfl:    metoprolol succinate (TOPROL-XL) 25 MG 24 hr tablet, TAKE 1 TABLET(25 MG) BY MOUTH AT BEDTIME, Disp: 90 tablet, Rfl: 1   modafinil (PROVIGIL) 200 MG tablet, Take 1 tablet (200 mg total) by mouth daily as needed., Disp: 15 tablet, Rfl: 0   OZEMPIC, 1  MG/DOSE, 4 MG/3ML SOPN, INJECT 1 MG UNDER THE SKIN ONCE WEEKLY, Disp: 3 mL, Rfl: 3   amoxicillin-clavulanate (AUGMENTIN) 875-125 MG tablet, Take 1 tablet by mouth 2 (two) times daily. (Patient not taking: Reported on 04/17/2023), Disp: 20 tablet, Rfl: 0   No Known Allergies    . The patient's tobacco use is:  Social History   Tobacco Use  Smoking Status Never  Smokeless Tobacco Never  He has been exposed to passive smoke. The patient's  alcohol use is:  Social History   Substance and Sexual Activity  Alcohol Use Yes   Alcohol/week: 2.0 standard drinks of alcohol   Types: 2 Standard drinks or equivalent per week   Review of Systems  Constitutional: Negative.   HENT: Negative.    Eyes: Negative.  Negative for blurred vision.  Respiratory: Negative.  Negative for shortness of breath.   Cardiovascular: Negative.  Negative for chest pain and palpitations.  Gastrointestinal: Negative.   Endocrine: Negative.   Genitourinary: Negative.   Musculoskeletal:  Positive for arthralgias.  Skin: Negative.   Allergic/Immunologic: Negative.   Neurological: Negative.   Hematological: Negative.      Today's Vitals   04/17/23 1059 04/17/23 1107  BP: (!) 140/96 (!) 140/90  Pulse: 87   Temp: 98.4 F (36.9 C)   SpO2: 98%   Weight: 241 lb (109.3 kg)   Height: 5\' 4"  (1.626 m)    Body mass index is 41.37 kg/m.  Wt Readings from Last 3 Encounters:  04/17/23 241 lb (109.3 kg)  07/03/22 238 lb 6.4 oz (108.1 kg)  02/18/22 241 lb 9.6 oz (109.6 kg)    Objective:  Physical Exam Vitals and nursing note reviewed.  Constitutional:      Appearance: Normal appearance.  HENT:     Head: Normocephalic and atraumatic.     Right Ear: Tympanic membrane, ear canal and external ear normal.     Left Ear: Tympanic membrane, ear canal and external ear normal.     Nose: Nose normal.     Mouth/Throat:     Mouth: Mucous membranes are moist.     Pharynx: Oropharynx is clear.  Eyes:     Extraocular Movements: Extraocular movements intact.     Conjunctiva/sclera: Conjunctivae normal.     Pupils: Pupils are equal, round, and reactive to light.  Cardiovascular:     Rate and Rhythm: Normal rate and regular rhythm.     Pulses: Normal pulses.          Dorsalis pedis pulses are 2+ on the right side and 2+ on the left side.     Heart sounds: Normal heart sounds.  Pulmonary:     Effort: Pulmonary effort is normal.     Breath sounds: Normal breath  sounds.  Chest:  Breasts:    Right: Normal. No swelling, bleeding, inverted nipple, mass or nipple discharge.     Left: Normal. No swelling, bleeding, inverted nipple, mass or nipple discharge.  Abdominal:     General: Bowel sounds are normal.     Palpations: Abdomen is soft.  Genitourinary:    Comments: Deferred  Musculoskeletal:        General: Normal range of motion.     Cervical back: Normal range of motion and neck supple.  Feet:     Right foot:     Protective Sensation: 5 sites tested.  5 sites sensed.     Skin integrity: Dry skin present.     Toenail Condition: Right toenails are long.  Left foot:     Protective Sensation: 5 sites tested.  5 sites sensed.     Skin integrity: Dry skin present.     Toenail Condition: Left toenails are long.  Skin:    General: Skin is warm.  Neurological:     General: No focal deficit present.     Mental Status: He is alert.  Psychiatric:        Mood and Affect: Mood normal.        Behavior: Behavior normal.         Assessment And Plan:     1. Encounter for annual physical exam Comments: A full exam was performed. DRE deferred.  PATIENT IS ADVISED TO GET 30-45 MINUTES REGULAR EXERCISE NO LESS THAN FOUR TO FIVE DAYS PER WEEK - BOTH WEIGHTBEARING EXERCISES AND AEROBIC ARE RECOMMENDED.  PATIENT IS ADVISED TO FOLLOW A HEALTHY DIET WITH AT LEAST SIX FRUITS/VEGGIES PER DAY, DECREASE INTAKE OF RED MEAT, AND TO INCREASE FISH INTAKE TO TWO DAYS PER WEEK.  MEATS/FISH SHOULD NOT BE FRIED, BAKED OR BROILED IS PREFERABLE.  IT IS ALSO IMPORTANT TO CUT BACK ON YOUR SUGAR INTAKE. PLEASE AVOID ANYTHING WITH ADDED SUGAR, CORN SYRUP OR OTHER SWEETENERS. IF YOU MUST USE A SWEETENER, YOU CAN TRY STEVIA. IT IS ALSO IMPORTANT TO AVOID ARTIFICIALLY SWEETENERS AND DIET BEVERAGES. LASTLY, I SUGGEST WEARING SPF 50 SUNSCREEN ON EXPOSED PARTS AND ESPECIALLY WHEN IN THE DIRECT SUNLIGHT FOR AN EXTENDED PERIOD OF TIME.  PLEASE AVOID FAST FOOD RESTAURANTS AND INCREASE  YOUR WATER INTAKE. - CMP14+EGFR - Lipid panel - CBC - Hemoglobin A1c - PSA  2. Dyslipidemia associated with type 2 diabetes mellitus (HCC) Comments: Chronic, diabetic foot exam was performed. LDL goal < 70. He will c/w weekly semaglutide. I will check labs as below.  I DISCUSSED WITH THE PATIENT AT LENGTH REGARDING THE GOALS OF GLYCEMIC CONTROL AND POSSIBLE LONG-TERM COMPLICATIONS.  I  ALSO STRESSED THE IMPORTANCE OF COMPLIANCE WITH HOME GLUCOSE MONITORING, DIETARY RESTRICTIONS INCLUDING AVOIDANCE OF SUGARY DRINKS/PROCESSED FOODS,  ALONG WITH REGULAR EXERCISE.  I  ALSO STRESSED THE IMPORTANCE OF ANNUAL EYE EXAMS, SELF FOOT CARE AND COMPLIANCE WITH OFFICE VISITS.  - POCT Urinalysis Dipstick (81002) - Microalbumin / creatinine urine ratio - EKG 12-Lead  3. Essential hypertension, benign Comments: Chronic, uncontrolled. EKG performed, NSR w/ diffuse nonspecific T abnormality--no new changes. He has been evaluated by Cardiology in the past. For now, he will c/w amlodipine 10mg , chlorthalidone 25mg , irbesartan 300mg  and metoprolol XL 50mg  daily. Importance of medication and dietary compliance was stressed to the patient. Will reassess in 3-4 months. If elevated, will need to consider adding an additional medication.  - POCT Urinalysis Dipstick (81002) - Microalbumin / creatinine urine ratio - EKG 12-Lead  4. Class 3 severe obesity due to excess calories with serious comorbidity and body mass index (BMI) of 40.0 to 44.9 in adult [facility]) Comments: BMI 41. He is encouraged to aim for at least 150 minutes of exercise/week, while striving for at least 150 minutes of exercise/week.  Patient was given opportunity to ask questions. Patient verbalized understanding of the plan and was able to repeat key elements of the plan. All questions were answered to their satisfaction.   I, Gwynneth Aliment, MD, have reviewed all documentation for this visit. The documentation on 04/17/23 for the exam, diagnosis,  procedures, and orders are all accurate and complete.   THE PATIENT IS ENCOURAGED TO PRACTICE SOCIAL DISTANCING DUE TO THE COVID-19 PANDEMIC.

## 2023-04-18 LAB — CBC
Hematocrit: 48.2 % (ref 37.5–51.0)
Hemoglobin: 15.1 g/dL (ref 13.0–17.7)
MCH: 24.7 pg — ABNORMAL LOW (ref 26.6–33.0)
MCHC: 31.3 g/dL — ABNORMAL LOW (ref 31.5–35.7)
MCV: 79 fL (ref 79–97)
Platelets: 178 10*3/uL (ref 150–450)
RBC: 6.11 x10E6/uL — ABNORMAL HIGH (ref 4.14–5.80)
RDW: 15.8 % — ABNORMAL HIGH (ref 11.6–15.4)
WBC: 6.1 10*3/uL (ref 3.4–10.8)

## 2023-04-18 LAB — PSA: Prostate Specific Ag, Serum: 0.3 ng/mL (ref 0.0–4.0)

## 2023-04-18 LAB — CMP14+EGFR
ALT: 37 IU/L (ref 0–44)
AST: 24 IU/L (ref 0–40)
Albumin/Globulin Ratio: 1.5 (ref 1.2–2.2)
Albumin: 4.5 g/dL (ref 4.1–5.1)
Alkaline Phosphatase: 103 IU/L (ref 44–121)
BUN/Creatinine Ratio: 15 (ref 9–20)
BUN: 16 mg/dL (ref 6–24)
Bilirubin Total: 0.5 mg/dL (ref 0.0–1.2)
CO2: 27 mmol/L (ref 20–29)
Calcium: 10.2 mg/dL (ref 8.7–10.2)
Chloride: 101 mmol/L (ref 96–106)
Creatinine, Ser: 1.04 mg/dL (ref 0.76–1.27)
Globulin, Total: 3.1 g/dL (ref 1.5–4.5)
Glucose: 96 mg/dL (ref 70–99)
Potassium: 3.5 mmol/L (ref 3.5–5.2)
Sodium: 145 mmol/L — ABNORMAL HIGH (ref 134–144)
Total Protein: 7.6 g/dL (ref 6.0–8.5)
eGFR: 91 mL/min/{1.73_m2} (ref >59)

## 2023-04-18 LAB — LIPID PANEL
Chol/HDL Ratio: 2.7 ratio (ref 0.0–5.0)
Cholesterol, Total: 133 mg/dL (ref 100–199)
HDL: 50 mg/dL (ref >39)
LDL Chol Calc (NIH): 69 mg/dL (ref 0–99)
Triglycerides: 69 mg/dL (ref 0–149)
VLDL Cholesterol Cal: 14 mg/dL (ref 5–40)

## 2023-04-18 LAB — HEMOGLOBIN A1C
Est. average glucose Bld gHb Est-mCnc: 137 mg/dL
Hgb A1c MFr Bld: 6.4 % — ABNORMAL HIGH (ref 4.8–5.6)

## 2023-04-18 LAB — MICROALBUMIN / CREATININE URINE RATIO
Creatinine, Urine: 209.2 mg/dL
Microalb/Creat Ratio: 12 mg/g creat (ref 0–29)
Microalbumin, Urine: 25.4 ug/mL

## 2023-05-01 ENCOUNTER — Telehealth: Payer: Self-pay

## 2023-05-05 DIAGNOSIS — M25511 Pain in right shoulder: Secondary | ICD-10-CM | POA: Diagnosis not present

## 2023-05-12 NOTE — Telephone Encounter (Signed)
error 

## 2023-05-13 ENCOUNTER — Other Ambulatory Visit: Payer: Self-pay | Admitting: Internal Medicine

## 2023-05-13 DIAGNOSIS — I10 Essential (primary) hypertension: Secondary | ICD-10-CM

## 2023-05-19 DIAGNOSIS — M25511 Pain in right shoulder: Secondary | ICD-10-CM | POA: Diagnosis not present

## 2023-05-31 DIAGNOSIS — M25511 Pain in right shoulder: Secondary | ICD-10-CM | POA: Diagnosis not present

## 2023-06-02 NOTE — Progress Notes (Unsigned)
Patient presents today for a bp check, patient currently taking amlodipine 10mg  PM, metoprolol succinate 25mg  PM, irbesartan 300mg  AM. Patient reports compliance with medications.  BP Readings from Last 3 Encounters:  06/03/23 120/80  04/17/23 (!) 140/90  07/03/22 132/80  Follow up- next appt great BP reading.

## 2023-06-03 ENCOUNTER — Ambulatory Visit: Payer: BC Managed Care – PPO

## 2023-06-03 VITALS — BP 120/80 | HR 87 | Temp 98.5°F | Ht 64.0 in | Wt 241.0 lb

## 2023-06-03 DIAGNOSIS — I10 Essential (primary) hypertension: Secondary | ICD-10-CM

## 2023-06-03 NOTE — Patient Instructions (Signed)
Hypertension, Adult Hypertension is another name for high blood pressure. High blood pressure forces your heart to work harder to pump blood. This can cause problems over time. There are two numbers in a blood pressure reading. There is a top number (systolic) over a bottom number (diastolic). It is best to have a blood pressure that is below 120/80. What are the causes? The cause of this condition is not known. Some other conditions can lead to high blood pressure. What increases the risk? Some lifestyle factors can make you more likely to develop high blood pressure: Smoking. Not getting enough exercise or physical activity. Being overweight. Having too much fat, sugar, calories, or salt (sodium) in your diet. Drinking too much alcohol. Other risk factors include: Having any of these conditions: Heart disease. Diabetes. High cholesterol. Kidney disease. Obstructive sleep apnea. Having a family history of high blood pressure and high cholesterol. Age. The risk increases with age. Stress. What are the signs or symptoms? High blood pressure may not cause symptoms. Very high blood pressure (hypertensive crisis) may cause: Headache. Fast or uneven heartbeats (palpitations). Shortness of breath. Nosebleed. Vomiting or feeling like you may vomit (nauseous). Changes in how you see. Very bad chest pain. Feeling dizzy. Seizures. How is this treated? This condition is treated by making healthy lifestyle changes, such as: Eating healthy foods. Exercising more. Drinking less alcohol. Your doctor may prescribe medicine if lifestyle changes do not help enough and if: Your top number is above 130. Your bottom number is above 80. Your personal target blood pressure may vary. Follow these instructions at home: Eating and drinking  If told, follow the DASH eating plan. To follow this plan: Fill one half of your plate at each meal with fruits and vegetables. Fill one fourth of your plate  at each meal with whole grains. Whole grains include whole-wheat pasta, brown rice, and whole-grain bread. Eat or drink low-fat dairy products, such as skim milk or low-fat yogurt. Fill one fourth of your plate at each meal with low-fat (lean) proteins. Low-fat proteins include fish, chicken without skin, eggs, beans, and tofu. Avoid fatty meat, cured and processed meat, or chicken with skin. Avoid pre-made or processed food. Limit the amount of salt in your diet to less than 1,500 mg each day. Do not drink alcohol if: Your doctor tells you not to drink. You are pregnant, may be pregnant, or are planning to become pregnant. If you drink alcohol: Limit how much you have to: 0-1 drink a day for women. 0-2 drinks a day for men. Know how much alcohol is in your drink. In the [place] one drink equals one 12 oz bottle of beer (355 mL), one 5 oz glass of wine (148 mL), or one 1 oz glass of hard liquor (44 mL). Lifestyle  Work with your doctor to stay at a healthy weight or to lose weight. Ask your doctor what the best weight is for you. Get at least 30 minutes of exercise that causes your heart to beat faster (aerobic exercise) most days of the week. This may include walking, swimming, or biking. Get at least 30 minutes of exercise that strengthens your muscles (resistance exercise) at least 3 days a week. This may include lifting weights or doing Pilates. Do not smoke or use any products that contain nicotine or tobacco. If you need help quitting, ask your doctor. Check your blood pressure at home as told by your doctor. Keep all follow-up visits. Medicines Take over-the-counter and prescription medicines   only as told by your doctor. Follow directions carefully. Do not skip doses of blood pressure medicine. The medicine does not work as well if you skip doses. Skipping doses also puts you at risk for problems. Ask your doctor about side effects or reactions to medicines that you should watch  for. Contact a doctor if: You think you are having a reaction to the medicine you are taking. You have headaches that keep coming back. You feel dizzy. You have swelling in your ankles. You have trouble with your vision. Get help right away if: You get a very bad headache. You start to feel mixed up (confused). You feel weak or numb. You feel faint. You have very bad pain in your: Chest. Belly (abdomen). You vomit more than once. You have trouble breathing. These symptoms may be an emergency. Get help right away. Call 911. Do not wait to see if the symptoms will go away. Do not drive yourself to the hospital. Summary Hypertension is another name for high blood pressure. High blood pressure forces your heart to work harder to pump blood. For most people, a normal blood pressure is less than 120/80. Making healthy choices can help lower blood pressure. If your blood pressure does not get lower with healthy choices, you may need to take medicine. This information is not intended to replace advice given to you by your health care provider. Make sure you discuss any questions you have with your health care provider. Document Revised: 10/04/2021 Document Reviewed: 10/04/2021 Elsevier Patient Education  2024 [employer]  

## 2023-06-04 DIAGNOSIS — M25511 Pain in right shoulder: Secondary | ICD-10-CM | POA: Diagnosis not present

## 2023-06-24 DIAGNOSIS — M25511 Pain in right shoulder: Secondary | ICD-10-CM | POA: Diagnosis not present

## 2023-06-25 DIAGNOSIS — M25511 Pain in right shoulder: Secondary | ICD-10-CM | POA: Diagnosis not present

## 2023-07-20 ENCOUNTER — Other Ambulatory Visit: Payer: Self-pay | Admitting: Internal Medicine

## 2023-07-20 DIAGNOSIS — I10 Essential (primary) hypertension: Secondary | ICD-10-CM

## 2023-07-21 DIAGNOSIS — M25511 Pain in right shoulder: Secondary | ICD-10-CM | POA: Diagnosis not present

## 2023-07-23 DIAGNOSIS — M25511 Pain in right shoulder: Secondary | ICD-10-CM | POA: Diagnosis not present

## 2023-07-25 ENCOUNTER — Other Ambulatory Visit: Payer: Self-pay | Admitting: Internal Medicine

## 2023-08-18 ENCOUNTER — Encounter: Payer: Self-pay | Admitting: Internal Medicine

## 2023-08-18 ENCOUNTER — Ambulatory Visit: Payer: BC Managed Care – PPO | Admitting: Internal Medicine

## 2023-08-18 VITALS — BP 164/110 | HR 86 | Temp 98.1°F | Ht 64.0 in | Wt 236.6 lb

## 2023-08-18 DIAGNOSIS — E785 Hyperlipidemia, unspecified: Secondary | ICD-10-CM

## 2023-08-18 DIAGNOSIS — E1169 Type 2 diabetes mellitus with other specified complication: Secondary | ICD-10-CM | POA: Diagnosis not present

## 2023-08-18 DIAGNOSIS — Z6841 Body Mass Index (BMI) 40.0 and over, adult: Secondary | ICD-10-CM

## 2023-08-18 DIAGNOSIS — I10 Essential (primary) hypertension: Secondary | ICD-10-CM | POA: Diagnosis not present

## 2023-08-18 NOTE — Assessment & Plan Note (Signed)
BMI 40.  He is congratulated on his 6lb weight loss since June 2024. He is encouraged to aim for at least 150 minutes of exercise/week.

## 2023-08-18 NOTE — Assessment & Plan Note (Signed)
Chronic, uncontrolled due to missed doses of amlodipine. He is encouraged to go straight to pharmacy from this appointment. He will also c/w chlorthalidone 25mg , irbesartan 300mg  daily and metoprolol 25mg  daily. He is encouraged to follow low sodium diet.

## 2023-08-18 NOTE — Patient Instructions (Addendum)
Farxiga Jardiance   Hypertension, Adult Hypertension is another name for high blood pressure. High blood pressure forces your heart to work harder to pump blood. This can cause problems over time. There are two numbers in a blood pressure reading. There is a top number (systolic) over a bottom number (diastolic). It is best to have a blood pressure that is below 120/80. What are the causes? The cause of this condition is not known. Some other conditions can lead to high blood pressure. What increases the risk? Some lifestyle factors can make you more likely to develop high blood pressure: Smoking. Not getting enough exercise or physical activity. Being overweight. Having too much fat, sugar, calories, or salt (sodium) in your diet. Drinking too much alcohol. Other risk factors include: Having any of these conditions: Heart disease. Diabetes. High cholesterol. Kidney disease. Obstructive sleep apnea. Having a family history of high blood pressure and high cholesterol. Age. The risk increases with age. Stress. What are the signs or symptoms? High blood pressure may not cause symptoms. Very high blood pressure (hypertensive crisis) may cause: Headache. Fast or uneven heartbeats (palpitations). Shortness of breath. Nosebleed. Vomiting or feeling like you may vomit (nauseous). Changes in how you see. Very bad chest pain. Feeling dizzy. Seizures. How is this treated? This condition is treated by making healthy lifestyle changes, such as: Eating healthy foods. Exercising more. Drinking less alcohol. Your doctor may prescribe medicine if lifestyle changes do not help enough and if: Your top number is above 130. Your bottom number is above 80. Your personal target blood pressure may vary. Follow these instructions at home: Eating and drinking  If told, follow the DASH eating plan. To follow this plan: Fill one half of your plate at each meal with fruits and vegetables. Fill  one fourth of your plate at each meal with whole grains. Whole grains include whole-wheat pasta, brown rice, and whole-grain bread. Eat or drink low-fat dairy products, such as skim milk or low-fat yogurt. Fill one fourth of your plate at each meal with low-fat (lean) proteins. Low-fat proteins include fish, chicken without skin, eggs, beans, and tofu. Avoid fatty meat, cured and processed meat, or chicken with skin. Avoid pre-made or processed food. Limit the amount of salt in your diet to less than 1,500 mg each day. Do not drink alcohol if: Your doctor tells you not to drink. You are pregnant, may be pregnant, or are planning to become pregnant. If you drink alcohol: Limit how much you have to: 0-1 drink a day for women. 0-2 drinks a day for men. Know how much alcohol is in your drink. In the [place] one drink equals one 12 oz bottle of beer (355 mL), one 5 oz glass of wine (148 mL), or one 1 oz glass of hard liquor (44 mL). Lifestyle  Work with your doctor to stay at a healthy weight or to lose weight. Ask your doctor what the best weight is for you. Get at least 30 minutes of exercise that causes your heart to beat faster (aerobic exercise) most days of the week. This may include walking, swimming, or biking. Get at least 30 minutes of exercise that strengthens your muscles (resistance exercise) at least 3 days a week. This may include lifting weights or doing Pilates. Do not smoke or use any products that contain nicotine or tobacco. If you need help quitting, ask your doctor. Check your blood pressure at home as told by your doctor. Keep all follow-up visits. Medicines Take  over-the-counter and prescription medicines only as told by your doctor. Follow directions carefully. Do not skip doses of blood pressure medicine. The medicine does not work as well if you skip doses. Skipping doses also puts you at risk for problems. Ask your doctor about side effects or reactions to medicines  that you should watch for. Contact a doctor if: You think you are having a reaction to the medicine you are taking. You have headaches that keep coming back. You feel dizzy. You have swelling in your ankles. You have trouble with your vision. Get help right away if: You get a very bad headache. You start to feel mixed up (confused). You feel weak or numb. You feel faint. You have very bad pain in your: Chest. Belly (abdomen). You vomit more than once. You have trouble breathing. These symptoms may be an emergency. Get help right away. Call 911. Do not wait to see if the symptoms will go away. Do not drive yourself to the hospital. Summary Hypertension is another name for high blood pressure. High blood pressure forces your heart to work harder to pump blood. For most people, a normal blood pressure is less than 120/80. Making healthy choices can help lower blood pressure. If your blood pressure does not get lower with healthy choices, you may need to take medicine. This information is not intended to replace advice given to you by your health care provider. Make sure you discuss any questions you have with your health care provider. Document Revised: 10/04/2021 Document Reviewed: 10/04/2021 Elsevier Patient Education  2024 [employer].

## 2023-08-18 NOTE — Progress Notes (Signed)
I,Victoria T Deloria Lair, CMA,acting as a Neurosurgeon for Gwynneth Aliment, MD.,have documented all relevant documentation on the behalf of Gwynneth Aliment, MD,as directed by  Gwynneth Aliment, MD while in the presence of Gwynneth Aliment, MD.  Subjective:  Patient ID: Austin [name] , male    DOB: 22-Jul-1981 , 42 y.o.   MRN: [id number]  Chief Complaint  Patient presents with   Hypertension   Diabetes    HPI  Patient is here for a f/u on his diabetes and blood pressure. He reports compliance with meds. He denies headaches, chest pain and shortness of breath.    He admits being out of amlodipine for a few days. He reports the pharmacy has filled it for him. He has to pick up medication today. He has no other concerns at this time. States morning sugars are typically in the 130s.     Hypertension This is a chronic problem. The current episode started more than 1 year ago. The problem has been gradually improving since onset. The problem is controlled. Pertinent negatives include no blurred vision, chest pain, palpitations or shortness of breath. The current treatment provides moderate improvement. Compliance problems include exercise.   Diabetes He presents for his follow-up diabetic visit. He has type 2 diabetes mellitus. Pertinent negatives for diabetes include no blurred vision, no chest pain, no polydipsia, no polyphagia and no polyuria. There are no hypoglycemic complications. Risk factors for coronary artery disease include diabetes mellitus, hypertension, male sex and obesity. An ACE inhibitor/angiotensin II receptor blocker is being taken.     Past Medical History:  Diagnosis Date   COVID-19    Essential hypertension 09/18/2017   Hypertension      Family History  Problem Relation Age of Onset   Diabetes Mother    Healthy Sister    Healthy Brother    Hypertension Maternal Grandmother    Stroke Maternal Grandmother    Multiple sclerosis Father      Current Outpatient Medications:     amLODipine (NORVASC) 10 MG tablet, TAKE 1 TABLET(10 MG) BY MOUTH DAILY, Disp: 90 tablet, Rfl: 1   atorvastatin (LIPITOR) 10 MG tablet, Take 1 tablet (10 mg total) by mouth daily., Disp: 30 tablet, Rfl: 11   chlorthalidone (HYGROTON) 25 MG tablet, TAKE 1 TABLET(25 MG) BY MOUTH DAILY, Disp: 90 tablet, Rfl: 1   clobetasol ointment (TEMOVATE) 0.05 %, Apply to worst areas of hand  twice daily. NEVER to face/groin, Disp: , Rfl:    fluticasone (FLONASE) 50 MCG/ACT nasal spray, Place 2 sprays into both nostrils daily., Disp: 16 g, Rfl: 0   irbesartan (AVAPRO) 300 MG tablet, Take 1 tablet (300 mg total) by mouth daily., Disp: 90 tablet, Rfl: 2   loratadine (CLARITIN) 10 MG tablet, Take 10 mg by mouth daily., Disp: , Rfl:    MAGNESIUM CITRATE PO, Take 500 mg by mouth at bedtime. , Disp: , Rfl:    metoprolol succinate (TOPROL-XL) 25 MG 24 hr tablet, TAKE 1 TABLET(25 MG) BY MOUTH AT BEDTIME, Disp: 90 tablet, Rfl: 1   modafinil (PROVIGIL) 200 MG tablet, Take 1 tablet (200 mg total) by mouth daily as needed., Disp: 15 tablet, Rfl: 0   OZEMPIC, 1 MG/DOSE, 4 MG/3ML SOPN, INJECT 1 MG UNDER THE SKIN ONE DAY A WEEK, Disp: 3 mL, Rfl: 3   amoxicillin-clavulanate (AUGMENTIN) 875-125 MG tablet, Take 1 tablet by mouth 2 (two) times daily. (Patient not taking: Reported on 04/17/2023), Disp: 20 tablet, Rfl: 0   No  Known Allergies   Review of Systems  Constitutional: Negative.   HENT: Negative.    Eyes: Negative.  Negative for blurred vision.  Respiratory: Negative.  Negative for shortness of breath.   Cardiovascular: Negative.  Negative for chest pain and palpitations.  Gastrointestinal: Negative.   Endocrine: Negative for polydipsia, polyphagia and polyuria.  Neurological: Negative.   Psychiatric/Behavioral: Negative.       Today's Vitals   08/18/23 1152 08/18/23 1223  BP: (!) 160/120 (!) 164/110  Pulse: 86   Temp: 98.1 F (36.7 C)   SpO2: 98%   Weight: 236 lb 9.6 oz (107.3 kg)   Height: 5\' 4"  (1.626 m)     Body mass index is 40.61 kg/m.  Wt Readings from Last 3 Encounters:  08/18/23 236 lb 9.6 oz (107.3 kg)  06/03/23 241 lb (109.3 kg)  04/17/23 241 lb (109.3 kg)     Objective:  Physical Exam Vitals and nursing note reviewed.  Constitutional:      Appearance: Normal appearance. He is obese.  HENT:     Head: Normocephalic and atraumatic.  Eyes:     Extraocular Movements: Extraocular movements intact.  Cardiovascular:     Rate and Rhythm: Normal rate and regular rhythm.     Heart sounds: Normal heart sounds.  Pulmonary:     Effort: Pulmonary effort is normal.     Breath sounds: Normal breath sounds.  Musculoskeletal:     Cervical back: Normal range of motion.  Skin:    General: Skin is warm.  Neurological:     General: No focal deficit present.     Mental Status: He is alert.  Psychiatric:        Mood and Affect: Mood normal.         Assessment And Plan:  Essential hypertension, benign Assessment & Plan: Chronic, uncontrolled due to missed doses of amlodipine. He is encouraged to go straight to pharmacy from this appointment. He will also c/w chlorthalidone 25mg , irbesartan 300mg  daily and metoprolol 25mg  daily. He is encouraged to follow low sodium diet.    Dyslipidemia associated with type 2 diabetes mellitus (HCC) Assessment & Plan: Chronic, pt is aware fasting BS are elevated. I plan to increase Ozempic to 2mg  weekly. He will first continue with 1mg  PLUS 0.5mg  weekly. I plan to transition him to 2mg  within two weeks. I will check labs as below. He will f/u in four months for re-evaluation.   Orders: -     Hemoglobin A1c -     BMP8+eGFR -     TSH  Class 3 severe obesity due to excess calories with serious comorbidity and body mass index (BMI) of 40.0 to 44.9 in adult [facility]) Assessment & Plan: BMI 40.  He is congratulated on his 6lb weight loss since June 2024. He is encouraged to aim for at least 150 minutes of exercise/week.    He is encouraged to strive for  BMI less than 30 to decrease cardiac risk. Advised to aim for at least 150 minutes of exercise per week.    Return in 2 weeks (on 09/01/2023), or nurse visit - bpcheck, for 4 MONTH DM .  Patient was given opportunity to ask questions. Patient verbalized understanding of the plan and was able to repeat key elements of the plan. All questions were answered to their satisfaction.    I, Gwynneth Aliment, MD, have reviewed all documentation for this visit. The documentation on 08/18/23 for the exam, diagnosis, procedures, and orders are all accurate  and complete.   IF YOU HAVE BEEN REFERRED TO A SPECIALIST, IT MAY TAKE 1-2 WEEKS TO SCHEDULE/PROCESS THE REFERRAL. IF YOU HAVE NOT HEARD FROM US/SPECIALIST IN TWO WEEKS, PLEASE GIVE [place] A CALL AT [phone].   THE PATIENT IS ENCOURAGED TO PRACTICE SOCIAL DISTANCING DUE TO THE COVID-19 PANDEMIC.

## 2023-08-18 NOTE — Assessment & Plan Note (Signed)
Chronic, pt is aware fasting BS are elevated. I plan to increase Ozempic to 2mg  weekly. He will first continue with 1mg  PLUS 0.5mg  weekly. I plan to transition him to 2mg  within two weeks. I will check labs as below. He will f/u in four months for re-evaluation.

## 2023-08-19 LAB — BMP8+EGFR
BUN/Creatinine Ratio: 12 (ref 9–20)
BUN: 13 mg/dL (ref 6–24)
CO2: 27 mmol/L (ref 20–29)
Calcium: 10.1 mg/dL (ref 8.7–10.2)
Chloride: 102 mmol/L (ref 96–106)
Creatinine, Ser: 1.07 mg/dL (ref 0.76–1.27)
Glucose: 90 mg/dL (ref 70–99)
Potassium: 3.8 mmol/L (ref 3.5–5.2)
Sodium: 143 mmol/L (ref 134–144)
eGFR: 88 mL/min/{1.73_m2} (ref >59)

## 2023-08-19 LAB — HEMOGLOBIN A1C
Est. average glucose Bld gHb Est-mCnc: 128 mg/dL
Hgb A1c MFr Bld: 6.1 % — ABNORMAL HIGH (ref 4.8–5.6)

## 2023-08-19 LAB — TSH: TSH: 1.36 u[IU]/mL (ref 0.450–4.500)

## 2023-08-28 DIAGNOSIS — G4733 Obstructive sleep apnea (adult) (pediatric): Secondary | ICD-10-CM | POA: Diagnosis not present

## 2023-08-28 NOTE — Progress Notes (Signed)
PATIENT: Austin [name] DOB: Sep 20, 1981  REASON FOR VISIT: follow up HISTORY FROM: patient   Virtual Visit via Video Note  I connected with Stefon D Kerwin on 08/29/23 at  9:45 AM EDT by a video enabled telemedicine application located remotely at [facility] and verified that I am speaking with the correct person using two identifiers who was located at their own home.   I discussed the limitations of evaluation and management by telemedicine and the availability of in person appointments. The patient expressed understanding and agreed to proceed.   PATIENT: Austin [name] DOB: October 03, 1981  REASON FOR VISIT: follow up HISTORY FROM: patient  HISTORY OF PRESENT ILLNESS: Today 08/29/23:  Austin [name] is a 42 y.o. male with a history of OSA on CPAP. Returns today for follow-up. Reports that CPAP is working well. Has two machines-1 at work and 1 at home.  His download is below.       REVIEW OF SYSTEMS: Out of a complete 14 system review of symptoms, the patient complains only of the following symptoms, and all other reviewed systems are negative.  ALLERGIES: No Known Allergies  HOME MEDICATIONS: Outpatient Medications Prior to Visit  Medication Sig Dispense Refill   amLODipine (NORVASC) 10 MG tablet TAKE 1 TABLET(10 MG) BY MOUTH DAILY 90 tablet 1   amoxicillin-clavulanate (AUGMENTIN) 875-125 MG tablet Take 1 tablet by mouth 2 (two) times daily. (Patient not taking: Reported on 04/17/2023) 20 tablet 0   atorvastatin (LIPITOR) 10 MG tablet Take 1 tablet (10 mg total) by mouth daily. 30 tablet 11   chlorthalidone (HYGROTON) 25 MG tablet TAKE 1 TABLET(25 MG) BY MOUTH DAILY 90 tablet 1   clobetasol ointment (TEMOVATE) 0.05 % Apply to worst areas of hand  twice daily. NEVER to face/groin     fluticasone (FLONASE) 50 MCG/ACT nasal spray Place 2 sprays into both nostrils daily. 16 g 0   irbesartan (AVAPRO) 300 MG tablet Take 1 tablet (300 mg total) by mouth  daily. 90 tablet 2   loratadine (CLARITIN) 10 MG tablet Take 10 mg by mouth daily.     MAGNESIUM CITRATE PO Take 500 mg by mouth at bedtime.      metoprolol succinate (TOPROL-XL) 25 MG 24 hr tablet TAKE 1 TABLET(25 MG) BY MOUTH AT BEDTIME 90 tablet 1   modafinil (PROVIGIL) 200 MG tablet Take 1 tablet (200 mg total) by mouth daily as needed. 15 tablet 0   OZEMPIC, 1 MG/DOSE, 4 MG/3ML SOPN INJECT 1 MG UNDER THE SKIN ONE DAY A WEEK 3 mL 3   No facility-administered medications prior to visit.    PAST MEDICAL HISTORY: Past Medical History:  Diagnosis Date   COVID-19    Essential hypertension 09/18/2017   Hypertension     PAST SURGICAL HISTORY: Past Surgical History:  Procedure Laterality Date   KNEE ARTHROSCOPY WITH ANTERIOR CRUCIATE LIGAMENT (ACL) REPAIR      FAMILY HISTORY: Family History  Problem Relation Age of Onset   Diabetes Mother    Healthy Sister    Healthy Brother    Hypertension Maternal Grandmother    Stroke Maternal Grandmother    Multiple sclerosis Father     SOCIAL HISTORY: Social History   Socioeconomic History   Marital status: Married    Spouse name: Not on file   Number of children: Not on file   Years of education: Not on file   Highest education level: Not on file  Occupational History  Not on file  Tobacco Use   Smoking status: Never   Smokeless tobacco: Never  Vaping Use   Vaping status: Never Used  Substance and Sexual Activity   Alcohol use: Yes    Alcohol/week: 2.0 standard drinks of alcohol    Types: 2 Standard drinks or equivalent per week   Drug use: No   Sexual activity: Not on file  Other Topics Concern   Not on file  Social History Narrative   Not on file   Social Determinants of Health   Financial Resource Strain: Not on file  Food Insecurity: Not on file  Transportation Needs: Not on file  Physical Activity: Not on file  Stress: Not on file  Social Connections: Unknown (04/02/2023)   Received from [facility]    Social Network    Social Network: Not on file  Intimate Partner Violence: Unknown (04/02/2023)   Received from [facility]   HITS    Physically Hurt: Not on file    Insult or Talk Down To: Not on file    Threaten Physical Harm: Not on file    Scream or Curse: Not on file      PHYSICAL EXAM Generalized: Well developed, in no acute distress   Neurological examination  Mentation: Alert oriented to time, place, history taking. Follows all commands speech and language fluent Cranial nerve II-XII: Facial symmetry noted  DIAGNOSTIC DATA (LABS, IMAGING, TESTING) - I reviewed patient records, labs, notes, testing and imaging myself where available.  Lab Results  Component Value Date   WBC 6.1 04/17/2023   HGB 15.1 04/17/2023   HCT 48.2 04/17/2023   MCV 79 04/17/2023   PLT 178 04/17/2023      Component Value Date/Time   NA 143 08/18/2023 1235   K 3.8 08/18/2023 1235   CL 102 08/18/2023 1235   CO2 27 08/18/2023 1235   GLUCOSE 90 08/18/2023 1235   GLUCOSE 189 (H) 01/25/2020 0719   BUN 13 08/18/2023 1235   CREATININE 1.07 08/18/2023 1235   CALCIUM 10.1 08/18/2023 1235   PROT 7.6 04/17/2023 1152   ALBUMIN 4.5 04/17/2023 1152   AST 24 04/17/2023 1152   ALT 37 04/17/2023 1152   ALKPHOS 103 04/17/2023 1152   BILITOT 0.5 04/17/2023 1152   GFRNONAA 99 10/05/2020 1147   GFRAA 114 10/05/2020 1147   Lab Results  Component Value Date   CHOL 133 04/17/2023   HDL 50 04/17/2023   LDLCALC 69 04/17/2023   TRIG 69 04/17/2023   CHOLHDL 2.7 04/17/2023   Lab Results  Component Value Date   HGBA1C 6.1 (H) 08/18/2023   No results found for: "VITAMINB12" Lab Results  Component Value Date   TSH 1.360 08/18/2023      ASSESSMENT AND PLAN 42 y.o. year old male  has a past medical history of COVID-19, Essential hypertension (09/18/2017), and Hypertension. here with:  OSA on CPAP  CPAP compliance excellent Residual AHI is good Encouraged patient to continue using CPAP nightly and  > 4 hours each night F/U in 1 year or sooner if needed    Austin Penny, MSN, NP-C 08/29/2023, 9:54 AM [facility] [address], [address] [place], [place] [address] [phone] .

## 2023-08-29 ENCOUNTER — Telehealth (INDEPENDENT_AMBULATORY_CARE_PROVIDER_SITE_OTHER): Payer: BC Managed Care – PPO | Admitting: Adult Health

## 2023-08-29 DIAGNOSIS — G4733 Obstructive sleep apnea (adult) (pediatric): Secondary | ICD-10-CM

## 2023-09-02 ENCOUNTER — Ambulatory Visit: Payer: BC Managed Care – PPO

## 2023-09-02 ENCOUNTER — Other Ambulatory Visit: Payer: Self-pay

## 2023-09-02 VITALS — BP 170/120 | HR 89 | Temp 98.4°F | Ht 64.0 in | Wt 236.0 lb

## 2023-09-02 DIAGNOSIS — I10 Essential (primary) hypertension: Secondary | ICD-10-CM

## 2023-09-02 MED ORDER — OZEMPIC (2 MG/DOSE) 8 MG/3ML ~~LOC~~ SOPN: 2.0000 mg | PEN_INJECTOR | SUBCUTANEOUS | 0 refills | Status: DC

## 2023-09-02 NOTE — Progress Notes (Signed)
Patient presents today for a bp check. Patient reports taking amlodipine 10mg , metoprolol 25mg  and irbesartan 300mg  and chlorthalidone 25mg  in the mornings. Patient reported he did take his meds this morning. I checked his bp and it was 162/110 p88. I waited 10 minutes and rechecked it was 170/120 p89. After speaking with provider patient was notified to take irbesartan 300mg  and chlorthalidone 25mg  in the mornings and he was also notified he metoprolol 25mg  has been increased to 50mg  and he is to take that and amlodipine 10mg  in the evenings. Patient is to follow up in 2 weeks for a nurse visit repeat bpc.       BP Readings from Last 3 Encounters:  08/18/23 (!) 164/110  06/03/23 120/80  04/17/23 (!) 140/90

## 2023-09-16 ENCOUNTER — Ambulatory Visit: Payer: BC Managed Care – PPO

## 2023-09-16 VITALS — BP 140/100 | HR 84 | Temp 98.1°F | Ht 64.0 in | Wt 236.0 lb

## 2023-09-16 DIAGNOSIS — I10 Essential (primary) hypertension: Secondary | ICD-10-CM

## 2023-09-16 MED ORDER — METOPROLOL SUCCINATE ER 50 MG PO TB24
50.0000 mg | ORAL_TABLET | Freq: Every day | ORAL | 2 refills | Status: DC
Start: 2023-09-16 — End: 2024-01-20

## 2023-09-16 NOTE — Progress Notes (Signed)
Patient presents today for a bp check. Patient reported he has not taken his bp meds yet due to him working at night. He takes the Metoprolol 50mg  in the evenings before his goes to work. He takes the amlodipine 10mg  and chlorhalidone 25mg  and irbesartan 300mg  in the mornings around 11am. I checked his bp today and it was 140/110 p 85. I waited 10 minutes and rechecked his bp it was 140/100 p84. Patient was advised to take his amlodipine 10mg  when he first gets home from work in the mornings. His other medications are to stay the same. He is to come back in 3 weeks for a [place] bpc. Pt was advised that if his bp is not better in 3 weeks he may need to take another bp medication in addition to his other meds or he will be referred to the hypertension clinic.     BP Readings from Last 3 Encounters:  09/02/23 (!) 170/120  08/18/23 (!) 164/110  06/03/23 120/80

## 2023-10-02 ENCOUNTER — Other Ambulatory Visit: Payer: Self-pay | Admitting: Internal Medicine

## 2023-10-07 ENCOUNTER — Ambulatory Visit: Payer: Managed Care, Other (non HMO)

## 2023-10-07 VITALS — BP 142/100 | HR 80 | Ht 64.0 in | Wt 236.0 lb

## 2023-10-07 DIAGNOSIS — I10 Essential (primary) hypertension: Secondary | ICD-10-CM

## 2023-10-07 NOTE — Progress Notes (Signed)
Patient presents today for a bpc. Patient reports he is taking metoprolol 50mg  in the evenings before he goes to work and he takes the amlodipine 10mg , irbesartan 300mg  and chlorthalidone 25mg  when he gets home from work. Patient reported he forgot to mention that another provider did prescribe him some meloxicam and he wondered if that is why is blood pressure is up. He reported he stopped taking the meloxicam. I checked his bp today and it is 150/100 P85. After speaking with provider she advised pt that she is going to send him to the hypertension clinic to be evaluated.     BP Readings from Last 3 Encounters:  09/16/23 (!) 140/100  09/02/23 (!) 170/120  08/18/23 (!) 164/110

## 2023-10-08 ENCOUNTER — Other Ambulatory Visit: Payer: Self-pay

## 2023-10-08 MED ORDER — OZEMPIC (1 MG/DOSE) 4 MG/3ML ~~LOC~~ SOPN: PEN_INJECTOR | SUBCUTANEOUS | 3 refills | Status: DC

## 2023-10-13 ENCOUNTER — Other Ambulatory Visit: Payer: Self-pay | Admitting: Internal Medicine

## 2023-10-23 ENCOUNTER — Other Ambulatory Visit: Payer: Self-pay | Admitting: Internal Medicine

## 2023-10-23 LAB — HM DIABETES EYE EXAM

## 2023-10-26 ENCOUNTER — Other Ambulatory Visit: Payer: Self-pay | Admitting: Internal Medicine

## 2023-10-26 DIAGNOSIS — I10 Essential (primary) hypertension: Secondary | ICD-10-CM

## 2023-11-06 LAB — HM DIABETES EYE EXAM

## 2023-11-06 MED ORDER — OZEMPIC (2 MG/DOSE) 8 MG/3ML ~~LOC~~ SOPN: 2.0000 mg | PEN_INJECTOR | SUBCUTANEOUS | 0 refills | Status: DC

## 2023-12-14 ENCOUNTER — Other Ambulatory Visit: Payer: Self-pay | Admitting: Internal Medicine

## 2023-12-14 DIAGNOSIS — I10 Essential (primary) hypertension: Secondary | ICD-10-CM

## 2023-12-26 ENCOUNTER — Other Ambulatory Visit: Payer: Self-pay | Admitting: Internal Medicine

## 2023-12-26 DIAGNOSIS — I10 Essential (primary) hypertension: Secondary | ICD-10-CM

## 2023-12-28 ENCOUNTER — Other Ambulatory Visit: Payer: Self-pay | Admitting: Internal Medicine

## 2024-01-06 ENCOUNTER — Ambulatory Visit: Payer: Self-pay | Admitting: Internal Medicine

## 2024-01-11 ENCOUNTER — Other Ambulatory Visit: Payer: Self-pay | Admitting: Internal Medicine

## 2024-01-20 ENCOUNTER — Ambulatory Visit (INDEPENDENT_AMBULATORY_CARE_PROVIDER_SITE_OTHER): Payer: Managed Care, Other (non HMO) | Admitting: Internal Medicine

## 2024-01-20 ENCOUNTER — Encounter: Payer: Self-pay | Admitting: Internal Medicine

## 2024-01-20 VITALS — BP 130/82 | HR 94 | Temp 98.1°F | Ht 64.0 in | Wt 243.4 lb

## 2024-01-20 DIAGNOSIS — E785 Hyperlipidemia, unspecified: Secondary | ICD-10-CM | POA: Diagnosis not present

## 2024-01-20 DIAGNOSIS — E1169 Type 2 diabetes mellitus with other specified complication: Secondary | ICD-10-CM

## 2024-01-20 DIAGNOSIS — J302 Other seasonal allergic rhinitis: Secondary | ICD-10-CM | POA: Diagnosis not present

## 2024-01-20 DIAGNOSIS — Z6841 Body Mass Index (BMI) 40.0 and over, adult: Secondary | ICD-10-CM

## 2024-01-20 DIAGNOSIS — I10 Essential (primary) hypertension: Secondary | ICD-10-CM | POA: Diagnosis not present

## 2024-01-20 DIAGNOSIS — E66813 Obesity, class 3: Secondary | ICD-10-CM

## 2024-01-20 MED ORDER — ATORVASTATIN CALCIUM 10 MG PO TABS: 10.0000 mg | ORAL_TABLET | Freq: Every day | ORAL | 1 refills | Status: DC

## 2024-01-20 MED ORDER — OZEMPIC (2 MG/DOSE) 8 MG/3ML ~~LOC~~ SOPN: 2.0000 mg | PEN_INJECTOR | SUBCUTANEOUS | 3 refills | Status: DC

## 2024-01-20 MED ORDER — FLUTICASONE PROPIONATE 50 MCG/ACT NA SUSP: 2.0000 | Freq: Every day | NASAL | 1 refills | Status: AC

## 2024-01-20 MED ORDER — METOPROLOL SUCCINATE ER 50 MG PO TB24: 50.0000 mg | ORAL_TABLET | Freq: Every day | ORAL | 2 refills | Status: DC

## 2024-01-20 MED ORDER — AMLODIPINE BESYLATE 10 MG PO TABS: ORAL_TABLET | ORAL | 1 refills | Status: DC

## 2024-01-20 NOTE — Assessment & Plan Note (Signed)
Chronic, he was given refill on Flonase to use prn.

## 2024-01-20 NOTE — Progress Notes (Signed)
I,Victoria T Deloria Lair, CMA,acting as a Neurosurgeon for Gwynneth Aliment, MD.,have documented all relevant documentation on the behalf of Gwynneth Aliment, MD,as directed by  Gwynneth Aliment, MD while in the presence of Gwynneth Aliment, MD.  Subjective:  Patient ID: Austin [name] , male    DOB: January 30, 1981 , 43 y.o.   MRN: [id number]  Chief Complaint  Patient presents with   Hypertension   Diabetes    HPI  Patient is here for a f/u on his diabetes and blood pressure. He reports compliance with meds. He denies having any headaches, chest pain and shortness of breath.  He has no specific questions or concerns.   Letter sent for DM eye exam.    Hypertension This is a chronic problem. The current episode started more than 1 year ago. The problem has been gradually improving since onset. The problem is controlled. Pertinent negatives include no blurred vision, chest pain, palpitations or shortness of breath. The current treatment provides moderate improvement. Compliance problems include exercise.   Diabetes He presents for his follow-up diabetic visit. He has type 2 diabetes mellitus. Pertinent negatives for diabetes include no blurred vision, no chest pain, no polydipsia, no polyphagia and no polyuria. There are no hypoglycemic complications. Risk factors for coronary artery disease include diabetes mellitus, hypertension, male sex and obesity. An ACE inhibitor/angiotensin II receptor blocker is being taken.     Past Medical History:  Diagnosis Date   COVID-19    DM (diabetes mellitus) (HCC)    Essential hypertension 09/18/2017   Hypertension      Family History  Problem Relation Age of Onset   Diabetes Mother    Healthy Sister    Healthy Brother    Hypertension Maternal Grandmother    Stroke Maternal Grandmother    Multiple sclerosis Father      Current Outpatient Medications:    chlorthalidone (HYGROTON) 25 MG tablet, TAKE 1 TABLET(25 MG) BY MOUTH DAILY, Disp: 90 tablet, Rfl: 1    clobetasol ointment (TEMOVATE) 0.05 %, Apply to worst areas of hand  twice daily. NEVER to face/groin, Disp: , Rfl:    irbesartan (AVAPRO) 300 MG tablet, TAKE 1 TABLET(300 MG) BY MOUTH DAILY, Disp: 90 tablet, Rfl: 2   loratadine (CLARITIN) 10 MG tablet, Take 10 mg by mouth daily., Disp: , Rfl:    MAGNESIUM CITRATE PO, Take 500 mg by mouth at bedtime. , Disp: , Rfl:    modafinil (PROVIGIL) 200 MG tablet, Take 1 tablet (200 mg total) by mouth daily as needed., Disp: 15 tablet, Rfl: 0   amLODipine (NORVASC) 10 MG tablet, TAKE 1 TABLET(10 MG) BY MOUTH DAILY, Disp: 90 tablet, Rfl: 1   atorvastatin (LIPITOR) 10 MG tablet, Take 1 tablet (10 mg total) by mouth daily. TAKE 1 TABLET(10 MG) BY MOUTH DAILY, Disp: 90 tablet, Rfl: 1   fluticasone (FLONASE) 50 MCG/ACT nasal spray, Place 2 sprays into both nostrils daily., Disp: 16 g, Rfl: 1   metoprolol succinate (TOPROL XL) 50 MG 24 hr tablet, Take 1 tablet (50 mg total) by mouth daily. Take with or immediately following a meal., Disp: 30 tablet, Rfl: 2   Semaglutide, 2 MG/DOSE, (OZEMPIC, 2 MG/DOSE,) 8 MG/3ML SOPN, Inject 2 mg into the skin once a week., Disp: 3 mL, Rfl: 3   No Known Allergies   Review of Systems  Constitutional: Negative.   HENT: Negative.    Eyes:  Negative for blurred vision.  Respiratory: Negative.  Negative for shortness of breath.  Cardiovascular: Negative.  Negative for chest pain and palpitations.  Gastrointestinal: Negative.   Endocrine: Negative for polydipsia, polyphagia and polyuria.  Skin: Negative.   Allergic/Immunologic: Negative.   Neurological: Negative.   Hematological: Negative.      Today's Vitals   01/20/24 1522  BP: 130/82  Pulse: 94  Temp: 98.1 F (36.7 C)  SpO2: 98%  Weight: 243 lb 6.4 oz (110.4 kg)  Height: 5\' 4"  (1.626 m)  PainSc: 0-No pain   Body mass index is 41.78 kg/m.  Wt Readings from Last 3 Encounters:  01/20/24 243 lb 6.4 oz (110.4 kg)  10/07/23 236 lb (107 kg)  09/16/23 236 lb (107 kg)      Objective:  Physical Exam Vitals and nursing note reviewed.  Constitutional:      Appearance: Normal appearance. He is obese.  HENT:     Head: Normocephalic and atraumatic.  Eyes:     Extraocular Movements: Extraocular movements intact.  Cardiovascular:     Rate and Rhythm: Normal rate and regular rhythm.     Heart sounds: Normal heart sounds.  Pulmonary:     Effort: Pulmonary effort is normal.     Breath sounds: Normal breath sounds.  Musculoskeletal:     Cervical back: Normal range of motion.  Skin:    General: Skin is warm.  Neurological:     General: No focal deficit present.     Mental Status: He is alert.  Psychiatric:        Mood and Affect: Mood normal.         Assessment And Plan:  Essential hypertension, benign Assessment & Plan: Chronic, fair control. He will continue with chlorthalidone 25mg , irbesartan 300mg  daily and metoprolol 25mg  daily. He is encouraged to follow low sodium diet.   Orders: -     CMP14+EGFR -     amLODIPine Besylate; TAKE 1 TABLET(10 MG) BY MOUTH DAILY  Dispense: 90 tablet; Refill: 1 -     Metoprolol Succinate ER; Take 1 tablet (50 mg total) by mouth daily. Take with or immediately following a meal.  Dispense: 30 tablet; Refill: 2  Dyslipidemia associated with type 2 diabetes mellitus (HCC) Assessment & Plan: Chronic, he is now on Ozempic 2mg  weekly and tolerating it well. He is encouraged to avoid sugary beverages/foods.    Orders: -     CMP14+EGFR -     Hemoglobin A1c  Seasonal allergic rhinitis, unspecified trigger Assessment & Plan: Chronic, he was given refill on Flonase to use prn.   Orders: -     Fluticasone Propionate; Place 2 sprays into both nostrils daily.  Dispense: 16 g; Refill: 1  Class 3 severe obesity due to excess calories with serious comorbidity and body mass index (BMI) of 40.0 to 44.9 in adult [facility]) Assessment & Plan: BMI 41.  He has gained 7lbs since October 2024.  He is encouraged to aim for at least  150 minutes of exercise/week. He is advised to strive to lose ten percent of his body weight to decrease cardiac risk.    Other orders -     Atorvastatin Calcium; Take 1 tablet (10 mg total) by mouth daily. TAKE 1 TABLET(10 MG) BY MOUTH DAILY  Dispense: 90 tablet; Refill: 1 -     Ozempic (2 MG/DOSE); Inject 2 mg into the skin once a week.  Dispense: 3 mL; Refill: 3  He is encouraged to strive for BMI less than 30 to decrease cardiac risk. Advised to aim for at least 150 minutes  of exercise per week.    Return if symptoms worsen or fail to improve.  Patient was given opportunity to ask questions. Patient verbalized understanding of the plan and was able to repeat key elements of the plan. All questions were answered to their satisfaction.    I, Gwynneth Aliment, MD, have reviewed all documentation for this visit. The documentation on 01/20/24 for the exam, diagnosis, procedures, and orders are all accurate and complete.   IF YOU HAVE BEEN REFERRED TO A SPECIALIST, IT MAY TAKE 1-2 WEEKS TO SCHEDULE/PROCESS THE REFERRAL. IF YOU HAVE NOT HEARD FROM US/SPECIALIST IN TWO WEEKS, PLEASE GIVE [place] A CALL AT [phone].   THE PATIENT IS ENCOURAGED TO PRACTICE SOCIAL DISTANCING DUE TO THE COVID-19 PANDEMIC.

## 2024-01-20 NOTE — Assessment & Plan Note (Signed)
BMI 41.  He has gained 7lbs since October 2024.  He is encouraged to aim for at least 150 minutes of exercise/week. He is advised to strive to lose ten percent of his body weight to decrease cardiac risk.

## 2024-01-20 NOTE — Assessment & Plan Note (Signed)
Chronic, he is now on Ozempic 2mg  weekly and tolerating it well. He is encouraged to avoid sugary beverages/foods.

## 2024-01-20 NOTE — Assessment & Plan Note (Signed)
Chronic, fair control. He will continue with chlorthalidone 25mg , irbesartan 300mg  daily and metoprolol 25mg  daily. He is encouraged to follow low sodium diet.

## 2024-01-20 NOTE — Patient Instructions (Signed)
Hypertension, Adult Hypertension is another name for high blood pressure. High blood pressure forces your heart to work harder to pump blood. This can cause problems over time. There are two numbers in a blood pressure reading. There is a top number (systolic) over a bottom number (diastolic). It is best to have a blood pressure that is below 120/80. What are the causes? The cause of this condition is not known. Some other conditions can lead to high blood pressure. What increases the risk? Some lifestyle factors can make you more likely to develop high blood pressure: Smoking. Not getting enough exercise or physical activity. Being overweight. Having too much fat, sugar, calories, or salt (sodium) in your diet. Drinking too much alcohol. Other risk factors include: Having any of these conditions: Heart disease. Diabetes. High cholesterol. Kidney disease. Obstructive sleep apnea. Having a family history of high blood pressure and high cholesterol. Age. The risk increases with age. Stress. What are the signs or symptoms? High blood pressure may not cause symptoms. Very high blood pressure (hypertensive crisis) may cause: Headache. Fast or uneven heartbeats (palpitations). Shortness of breath. Nosebleed. Vomiting or feeling like you may vomit (nauseous). Changes in how you see. Very bad chest pain. Feeling dizzy. Seizures. How is this treated? This condition is treated by making healthy lifestyle changes, such as: Eating healthy foods. Exercising more. Drinking less alcohol. Your doctor may prescribe medicine if lifestyle changes do not help enough and if: Your top number is above 130. Your bottom number is above 80. Your personal target blood pressure may vary. Follow these instructions at home: Eating and drinking  If told, follow the DASH eating plan. To follow this plan: Fill one half of your plate at each meal with fruits and vegetables. Fill one fourth of your plate  at each meal with whole grains. Whole grains include whole-wheat pasta, brown rice, and whole-grain bread. Eat or drink low-fat dairy products, such as skim milk or low-fat yogurt. Fill one fourth of your plate at each meal with low-fat (lean) proteins. Low-fat proteins include fish, chicken without skin, eggs, beans, and tofu. Avoid fatty meat, cured and processed meat, or chicken with skin. Avoid pre-made or processed food. Limit the amount of salt in your diet to less than 1,500 mg each day. Do not drink alcohol if: Your doctor tells you not to drink. You are pregnant, may be pregnant, or are planning to become pregnant. If you drink alcohol: Limit how much you have to: 0-1 drink a day for women. 0-2 drinks a day for men. Know how much alcohol is in your drink. In the [place] one drink equals one 12 oz bottle of beer (355 mL), one 5 oz glass of wine (148 mL), or one 1 oz glass of hard liquor (44 mL). Lifestyle  Work with your doctor to stay at a healthy weight or to lose weight. Ask your doctor what the best weight is for you. Get at least 30 minutes of exercise that causes your heart to beat faster (aerobic exercise) most days of the week. This may include walking, swimming, or biking. Get at least 30 minutes of exercise that strengthens your muscles (resistance exercise) at least 3 days a week. This may include lifting weights or doing Pilates. Do not smoke or use any products that contain nicotine or tobacco. If you need help quitting, ask your doctor. Check your blood pressure at home as told by your doctor. Keep all follow-up visits. Medicines Take over-the-counter and prescription medicines   only as told by your doctor. Follow directions carefully. Do not skip doses of blood pressure medicine. The medicine does not work as well if you skip doses. Skipping doses also puts you at risk for problems. Ask your doctor about side effects or reactions to medicines that you should watch  for. Contact a doctor if: You think you are having a reaction to the medicine you are taking. You have headaches that keep coming back. You feel dizzy. You have swelling in your ankles. You have trouble with your vision. Get help right away if: You get a very bad headache. You start to feel mixed up (confused). You feel weak or numb. You feel faint. You have very bad pain in your: Chest. Belly (abdomen). You vomit more than once. You have trouble breathing. These symptoms may be an emergency. Get help right away. Call 911. Do not wait to see if the symptoms will go away. Do not drive yourself to the hospital. Summary Hypertension is another name for high blood pressure. High blood pressure forces your heart to work harder to pump blood. For most people, a normal blood pressure is less than 120/80. Making healthy choices can help lower blood pressure. If your blood pressure does not get lower with healthy choices, you may need to take medicine. This information is not intended to replace advice given to you by your health care provider. Make sure you discuss any questions you have with your health care provider. Document Revised: 10/04/2021 Document Reviewed: 10/04/2021 Elsevier Patient Education  2024 [employer]  

## 2024-01-21 LAB — CMP14+EGFR
ALT: 59 [IU]/L — ABNORMAL HIGH (ref 0–44)
AST: 36 [IU]/L (ref 0–40)
Albumin: 4.5 g/dL (ref 4.1–5.1)
Alkaline Phosphatase: 87 [IU]/L (ref 44–121)
BUN/Creatinine Ratio: 14 (ref 9–20)
BUN: 16 mg/dL (ref 6–24)
Bilirubin Total: 0.3 mg/dL (ref 0.0–1.2)
CO2: 26 mmol/L (ref 20–29)
Calcium: 9.8 mg/dL (ref 8.7–10.2)
Chloride: 102 mmol/L (ref 96–106)
Creatinine, Ser: 1.14 mg/dL (ref 0.76–1.27)
Globulin, Total: 2.5 g/dL (ref 1.5–4.5)
Glucose: 124 mg/dL — ABNORMAL HIGH (ref 70–99)
Potassium: 3.7 mmol/L (ref 3.5–5.2)
Sodium: 145 mmol/L — ABNORMAL HIGH (ref 134–144)
Total Protein: 7 g/dL (ref 6.0–8.5)
eGFR: 81 mL/min/{1.73_m2} (ref >59)

## 2024-01-21 LAB — HEMOGLOBIN A1C
Est. average glucose Bld gHb Est-mCnc: 143 mg/dL
Hgb A1c MFr Bld: 6.6 % — ABNORMAL HIGH (ref 4.8–5.6)

## 2024-01-22 ENCOUNTER — Institutional Professional Consult (permissible substitution) (HOSPITAL_BASED_OUTPATIENT_CLINIC_OR_DEPARTMENT_OTHER): Payer: Managed Care, Other (non HMO) | Admitting: Family

## 2024-02-12 ENCOUNTER — Other Ambulatory Visit: Payer: Self-pay | Admitting: Internal Medicine

## 2024-02-12 DIAGNOSIS — I10 Essential (primary) hypertension: Secondary | ICD-10-CM

## 2024-02-26 ENCOUNTER — Institutional Professional Consult (permissible substitution) (HOSPITAL_BASED_OUTPATIENT_CLINIC_OR_DEPARTMENT_OTHER): Payer: Managed Care, Other (non HMO) | Admitting: Family

## 2024-03-11 ENCOUNTER — Institutional Professional Consult (permissible substitution) (HOSPITAL_BASED_OUTPATIENT_CLINIC_OR_DEPARTMENT_OTHER): Payer: Managed Care, Other (non HMO) | Admitting: Family

## 2024-04-19 ENCOUNTER — Encounter: Payer: Self-pay | Admitting: Internal Medicine

## 2024-04-19 ENCOUNTER — Ambulatory Visit (INDEPENDENT_AMBULATORY_CARE_PROVIDER_SITE_OTHER): Payer: BC Managed Care – PPO | Admitting: Internal Medicine

## 2024-04-19 VITALS — BP 130/84 | HR 91 | Temp 98.2°F | Ht 64.0 in | Wt 229.4 lb

## 2024-04-19 DIAGNOSIS — R748 Abnormal levels of other serum enzymes: Secondary | ICD-10-CM | POA: Diagnosis not present

## 2024-04-19 DIAGNOSIS — R252 Cramp and spasm: Secondary | ICD-10-CM | POA: Diagnosis not present

## 2024-04-19 DIAGNOSIS — I10 Essential (primary) hypertension: Secondary | ICD-10-CM | POA: Diagnosis not present

## 2024-04-19 DIAGNOSIS — E785 Hyperlipidemia, unspecified: Secondary | ICD-10-CM | POA: Diagnosis not present

## 2024-04-19 DIAGNOSIS — E1169 Type 2 diabetes mellitus with other specified complication: Secondary | ICD-10-CM | POA: Diagnosis not present

## 2024-04-19 DIAGNOSIS — Z6839 Body mass index (BMI) 39.0-39.9, adult: Secondary | ICD-10-CM

## 2024-04-19 DIAGNOSIS — Z0000 Encounter for general adult medical examination without abnormal findings: Secondary | ICD-10-CM | POA: Diagnosis not present

## 2024-04-19 DIAGNOSIS — E66812 Obesity, class 2: Secondary | ICD-10-CM

## 2024-04-19 LAB — POCT URINALYSIS DIP (CLINITEK)
Bilirubin, UA: NEGATIVE
Glucose, UA: NEGATIVE mg/dL
Ketones, POC UA: NEGATIVE mg/dL
Leukocytes, UA: NEGATIVE
Nitrite, UA: NEGATIVE
POC PROTEIN,UA: NEGATIVE
Spec Grav, UA: 1.025 (ref 1.010–1.025)
Urobilinogen, UA: 2 U/dL — AB
pH, UA: 6 (ref 5.0–8.0)

## 2024-04-19 MED ORDER — CHLORTHALIDONE 25 MG PO TABS: ORAL_TABLET | ORAL | 1 refills | Status: DC

## 2024-04-19 NOTE — Assessment & Plan Note (Signed)
 Nocturnal lower leg cramps likely due to dehydration. Suggested increased fluid intake. - Check electrolytes. - Encourage increased water intake.

## 2024-04-19 NOTE — Assessment & Plan Note (Signed)
 Chronic, diabetic foot exam was performed.  He is now on Ozempic  2mg  weekly and tolerating it well. He is encouraged to avoid sugary beverages/foods.  LDL goal is less than 70, continue with statin therapy. He lost 14 pounds due to lifestyle changes. Not using continuous glucose monitor due to availability. Discussed Dexcom and Freestyle monitors, with prior Freestyle use. Samples provided for preference. - Order HbA1c test. - Provide samples of Dexcom and Freestyle continuous glucose monitors. - Schedule follow-up diabetes check in four months. I DISCUSSED WITH THE PATIENT AT LENGTH REGARDING THE GOALS OF GLYCEMIC CONTROL AND POSSIBLE LONG-TERM COMPLICATIONS.  I  ALSO STRESSED THE IMPORTANCE OF COMPLIANCE WITH HOME GLUCOSE MONITORING, DIETARY RESTRICTIONS INCLUDING AVOIDANCE OF SUGARY DRINKS/PROCESSED FOODS,  ALONG WITH REGULAR EXERCISE.  I  ALSO STRESSED THE IMPORTANCE OF ANNUAL EYE EXAMS, SELF FOOT CARE AND COMPLIANCE WITH OFFICE VISITS.

## 2024-04-19 NOTE — Assessment & Plan Note (Signed)
 He was congratulated on his 14lb weight loss. I hope this is not due to elevated blood sugars. He is encouraged to incorporate more exercise into his daily routine.

## 2024-04-19 NOTE — Patient Instructions (Signed)
 Health Maintenance, Male  Adopting a healthy lifestyle and getting preventive care are important in promoting health and wellness. Ask your health care provider about:  The right schedule for you to have regular tests and exams.  Things you can do on your own to prevent diseases and keep yourself healthy.  What should I know about diet, weight, and exercise?  Eat a healthy diet    Eat a diet that includes plenty of vegetables, fruits, low-fat dairy products, and lean protein.  Do not eat a lot of foods that are high in solid fats, added sugars, or sodium.  Maintain a healthy weight  Body mass index (BMI) is a measurement that can be used to identify possible weight problems. It estimates body fat based on height and weight. Your health care provider can help determine your BMI and help you achieve or maintain a healthy weight.  Get regular exercise  Get regular exercise. This is one of the most important things you can do for your health. Most adults should:  Exercise for at least 150 minutes each week. The exercise should increase your heart rate and make you sweat (moderate-intensity exercise).  Do strengthening exercises at least twice a week. This is in addition to the moderate-intensity exercise.  Spend less time sitting. Even light physical activity can be beneficial.  Watch cholesterol and blood lipids  Have your blood tested for lipids and cholesterol at 45 years of age, then have this test every 5 years.  You may need to have your cholesterol levels checked more often if:  Your lipid or cholesterol levels are high.  You are older than 45 years of age.  You are at high risk for heart disease.  What should I know about cancer screening?  Many types of cancers can be detected early and may often be prevented. Depending on your health history and family history, you may need to have cancer screening at various ages. This may include screening for:  Colorectal cancer.  Prostate cancer.  Skin cancer.  Lung  cancer.  What should I know about heart disease, diabetes, and high blood pressure?  Blood pressure and heart disease  High blood pressure causes heart disease and increases the risk of stroke. This is more likely to develop in people who have high blood pressure readings or are overweight.  Talk with your health care provider about your target blood pressure readings.  Have your blood pressure checked:  Every 3-5 years if you are 45-95 years of age.  Every year if you are 85 years old or older.  If you are between the ages of 29 and 29 and are a current or former smoker, ask your health care provider if you should have a one-time screening for abdominal aortic aneurysm (AAA).  Diabetes  Have regular diabetes screenings. This checks your fasting blood sugar level. Have the screening done:  Once every three years after age 45 if you are at a normal weight and have a low risk for diabetes.  More often and at a younger age if you are overweight or have a high risk for diabetes.  What should I know about preventing infection?  Hepatitis B  If you have a higher risk for hepatitis B, you should be screened for this virus. Talk with your health care provider to find out if you are at risk for hepatitis B infection.  Hepatitis C  Blood testing is recommended for:  Everyone born from [birth year] through 1967.  Anyone  with known risk factors for hepatitis C.  Sexually transmitted infections (STIs)  You should be screened each year for STIs, including gonorrhea and chlamydia, if:  You are sexually active and are younger than 45 years of age.  You are older than 45 years of age and your health care provider tells you that you are at risk for this type of infection.  Your sexual activity has changed since you were last screened, and you are at increased risk for chlamydia or gonorrhea. Ask your health care provider if you are at risk.  Ask your health care provider about whether you are at high risk for HIV. Your health care provider  may recommend a prescription medicine to help prevent HIV infection. If you choose to take medicine to prevent HIV, you should first get tested for HIV. You should then be tested every 3 months for as long as you are taking the medicine.  Follow these instructions at home:  Alcohol use  Do not drink alcohol if your health care provider tells you not to drink.  If you drink alcohol:  Limit how much you have to 0-2 drinks a day.  Know how much alcohol is in your drink. In the [place] one drink equals one 12 oz bottle of beer (355 mL), one 5 oz glass of wine (148 mL), or one 1 oz glass of hard liquor (44 mL).  Lifestyle  Do not use any products that contain nicotine or tobacco. These products include cigarettes, chewing tobacco, and vaping devices, such as e-cigarettes. If you need help quitting, ask your health care provider.  Do not use street drugs.  Do not share needles.  Ask your health care provider for help if you need support or information about quitting drugs.  General instructions  Schedule regular health, dental, and eye exams.  Stay current with your vaccines.  Tell your health care provider if:  You often feel depressed.  You have ever been abused or do not feel safe at home.  Summary  Adopting a healthy lifestyle and getting preventive care are important in promoting health and wellness.  Follow your health care provider's instructions about healthy diet, exercising, and getting tested or screened for diseases.  Follow your health care provider's instructions on monitoring your cholesterol and blood pressure.  This information is not intended to replace advice given to you by your health care provider. Make sure you discuss any questions you have with your health care provider.  Document Revised: 05/07/2021 Document Reviewed: 05/07/2021  Elsevier Patient Education  2024 [employer].

## 2024-04-19 NOTE — Assessment & Plan Note (Signed)
 Chronic, fair control. EKG performed, NSR w/ diffuse nonspecific T abnormality, no new changes.  He will continue with amlodipine  10mg  daily, chlorthalidone  25mg , irbesartan  300mg  daily and metoprolol  25mg  daily. He is encouraged to follow low sodium diet.

## 2024-04-19 NOTE — Assessment & Plan Note (Signed)

## 2024-04-19 NOTE — Progress Notes (Signed)
 I,Victoria T Basil Lim, CMA,acting as a Neurosurgeon for Smiley Dung, MD.,have documented all relevant documentation on the behalf of Smiley Dung, MD,as directed by  Smiley Dung, MD while in the presence of Smiley Dung, MD.  Subjective:   Patient ID: Austin [name] , male    DOB: 1981/07/10 , 43 y.o.   MRN: [id number]  Chief Complaint  Patient presents with   Annual Exam    Patient presents today for annual exam. He reports compliance with medications. Denies headache, chest pain & sob. He has no specific questions or concerns.    Hypertension   Diabetes    HPI Discussed the use of AI scribe software for clinical note transcription with the patient, who gave verbal consent to proceed.  History of Present Illness Austin [name] is a 43 year old male with diabetes who presents for a physical and diabetes check.  He is on a medication regimen that includes amlodipine  10 mg daily, atorvastatin  10 mg daily, chlorthalidone  25 mg daily, irbesartan  300 mg daily, metoprolol  50 mg daily, and Ozempic . He uses clobetasol cream for eczema flare-ups on his hands and takes Claritin  as needed. He has not been using Provigil  as he has been managing his sleep better with a more scheduled sleep pattern. No issues with his current medications.  He has lost 14 pounds, which he attributes to staying up late and not eating, related to his school and work commitments. No changes in urinary stream or frequency, and he seldom gets up at night to urinate. He experiences lower leg cramps at night, which he describes as 'weird'. He acknowledges that he might not be drinking enough water, estimating his intake at one to two bottles a day.  He is currently attending school for internet security, which has impacted his ability to exercise. He has also recently switched jobs, which involves learning new protocols and has contributed to his busy schedule.      Hypertension This is a chronic problem. The current  episode started more than 1 year ago. The problem has been gradually improving since onset. The problem is controlled. Pertinent negatives include no blurred vision, chest pain, palpitations or shortness of breath. The current treatment provides moderate improvement. Compliance problems include exercise.   Diabetes He presents for his follow-up diabetic visit. He has type 2 diabetes mellitus. Pertinent negatives for diabetes include no blurred vision, no chest pain, no polydipsia, no polyphagia and no polyuria. There are no hypoglycemic complications. Risk factors for coronary artery disease include diabetes mellitus, hypertension, male sex and obesity. An ACE inhibitor/angiotensin II receptor blocker is being taken.     Past Medical History:  Diagnosis Date   COVID-19    DM (diabetes mellitus) (HCC)    Essential hypertension 09/18/2017   Hypertension      Family History  Problem Relation Age of Onset   Diabetes Mother    Multiple sclerosis Father    Healthy Sister    Healthy Brother    Hypertension Maternal Grandmother    Stroke Maternal Grandmother    Hypertension Maternal Uncle      Current Outpatient Medications:    amLODipine  (NORVASC ) 10 MG tablet, TAKE 1 TABLET(10 MG) BY MOUTH DAILY, Disp: 90 tablet, Rfl: 1   atorvastatin  (LIPITOR) 10 MG tablet, Take 1 tablet (10 mg total) by mouth daily. TAKE 1 TABLET(10 MG) BY MOUTH DAILY, Disp: 90 tablet, Rfl: 1   clobetasol ointment (TEMOVATE) 0.05 %, Apply to worst areas of hand  twice  daily. NEVER to face/groin, Disp: , Rfl:    fluticasone  (FLONASE ) 50 MCG/ACT nasal spray, Place 2 sprays into both nostrils daily., Disp: 16 g, Rfl: 1   irbesartan  (AVAPRO ) 300 MG tablet, TAKE 1 TABLET(300 MG) BY MOUTH DAILY, Disp: 90 tablet, Rfl: 2   loratadine  (CLARITIN ) 10 MG tablet, Take 10 mg by mouth daily., Disp: , Rfl:    MAGNESIUM CITRATE PO, Take 500 mg by mouth at bedtime. , Disp: , Rfl:    modafinil  (PROVIGIL ) 200 MG tablet, Take 1 tablet (200  mg total) by mouth daily as needed., Disp: 15 tablet, Rfl: 0   Semaglutide , 2 MG/DOSE, (OZEMPIC , 2 MG/DOSE,) 8 MG/3ML SOPN, Inject 2 mg into the skin once a week., Disp: 3 mL, Rfl: 3   chlorthalidone  (HYGROTON ) 25 MG tablet, TAKE 1 TABLET(25 MG) BY MOUTH DAILY, Disp: 90 tablet, Rfl: 1   nebivolol  (BYSTOLIC ) 10 MG tablet, Take 1 tablet (10 mg total) by mouth daily., Disp: 30 tablet, Rfl: 2   No Known Allergies   Men's preventive visit. Patient Health Questionnaire (PHQ-2) is  Flowsheet Row Office Visit from 04/19/2024 in [facility]  PHQ-2 Total Score 0     . Patient is on a healthy diet. Marital status: Married. Relevant history for alcohol use is:  Social History   Substance and Sexual Activity  Alcohol Use Yes   Alcohol/week: 2.0 standard drinks of alcohol   Types: 2 Standard drinks or equivalent per week  . Relevant history for tobacco use is:  Social History   Tobacco Use  Smoking Status Never  Smokeless Tobacco Never  .   Review of Systems  Constitutional: Negative.   HENT: Negative.    Eyes: Negative.  Negative for blurred vision.  Respiratory: Negative.  Negative for shortness of breath.   Cardiovascular: Negative.  Negative for chest pain and palpitations.  Gastrointestinal: Negative.   Endocrine: Negative.  Negative for polydipsia, polyphagia and polyuria.  Genitourinary: Negative.   Musculoskeletal:  Positive for myalgias.  Skin: Negative.   Allergic/Immunologic: Negative.   Neurological: Negative.   Hematological: Negative.   Psychiatric/Behavioral: Negative.       Today's Vitals   04/19/24 1106  BP: 130/84  Pulse: 91  Temp: 98.2 F (36.8 C)  SpO2: 98%  Weight: 229 lb 6.4 oz (104.1 kg)  Height: 5\' 4"  (1.626 m)   Body mass index is 39.38 kg/m.  Wt Readings from Last 3 Encounters:  04/22/24 231 lb 14.4 oz (105.2 kg)  04/19/24 229 lb 6.4 oz (104.1 kg)  01/20/24 243 lb 6.4 oz (110.4 kg)   BP Readings from Last 3  Encounters:  04/22/24 (!) 135/91  04/19/24 130/84  01/20/24 130/82    Objective:  Physical Exam Vitals and nursing note reviewed.  Constitutional:      Appearance: Normal appearance. He is obese.  HENT:     Head: Normocephalic and atraumatic.     Right Ear: Tympanic membrane, ear canal and external ear normal.     Left Ear: Tympanic membrane, ear canal and external ear normal.     Nose: Nose normal.     Mouth/Throat:     Mouth: Mucous membranes are moist.     Pharynx: Oropharynx is clear.  Eyes:     Extraocular Movements: Extraocular movements intact.     Conjunctiva/sclera: Conjunctivae normal.     Pupils: Pupils are equal, round, and reactive to light.  Cardiovascular:     Rate and Rhythm: Normal rate and regular  rhythm.     Pulses: Normal pulses.          Dorsalis pedis pulses are 2+ on the right side and 2+ on the left side.     Heart sounds: Normal heart sounds.  Pulmonary:     Effort: Pulmonary effort is normal.     Breath sounds: Normal breath sounds.  Chest:  Breasts:    Right: Normal. No swelling, bleeding, inverted nipple, mass or nipple discharge.     Left: Normal. No swelling, bleeding, inverted nipple, mass or nipple discharge.  Abdominal:     General: Bowel sounds are normal.     Palpations: Abdomen is soft.  Genitourinary:    Comments: Deferred  Musculoskeletal:        General: Normal range of motion.     Cervical back: Normal range of motion and neck supple.  Feet:     Right foot:     Protective Sensation: 5 sites tested.  5 sites sensed.     Skin integrity: Dry skin present.     Toenail Condition: Right toenails are long.     Left foot:     Protective Sensation: 5 sites tested.  5 sites sensed.     Skin integrity: Dry skin present.     Toenail Condition: Left toenails are long.  Skin:    General: Skin is warm.  Neurological:     General: No focal deficit present.     Mental Status: He is alert.  Psychiatric:        Mood and Affect: Mood  normal.        Behavior: Behavior normal.         Assessment And Plan:    Encounter for annual physical exam Assessment & Plan: A full exam was performed.  DRE deferred, per patient request.  He is advised to get 30-45 minutes of regular exercise, no less than four to five days per week. Both weight-bearing and aerobic exercises are recommended.  He is advised to follow a healthy diet with at least six fruits/veggies per day, decrease intake of red meat and other saturated fats and to increase fish intake to twice weekly.  Meats/fish should not be fried -- baked, boiled or broiled is preferable. It is also important to cut back on your sugar intake.  Be sure to read labels - try to avoid anything with added sugar, high fructose corn syrup or other sweeteners.  If you must use a sweetener, you can try stevia or monkfruit.  It is also important to avoid artificially sweetened foods/beverages and diet drinks. Lastly, wear SPF 50 sunscreen on exposed skin and when in direct sunlight for an extended period of time.  Be sure to avoid fast food restaurants and aim for at least 60 ounces of water daily.      Orders: -     CMP14+EGFR -     Lipid panel -     Hemoglobin A1c -     CBC -     PSA  Essential hypertension, benign Assessment & Plan: Chronic, fair control. EKG performed, NSR w/ diffuse nonspecific T abnormality, no new changes.  He will continue with amlodipine  10mg  daily, chlorthalidone  25mg , irbesartan  300mg  daily and metoprolol  25mg  daily. He is encouraged to follow low sodium diet.   Orders: -     Microalbumin / creatinine urine ratio -     EKG 12-Lead -     POCT URINALYSIS DIP (CLINITEK) -     Chlorthalidone ; TAKE  1 TABLET(25 MG) BY MOUTH DAILY  Dispense: 90 tablet; Refill: 1  Dyslipidemia associated with type 2 diabetes mellitus (HCC) Assessment & Plan: Chronic, diabetic foot exam was performed.  He is now on Ozempic  2mg  weekly and tolerating it well. He is encouraged to avoid  sugary beverages/foods.  LDL goal is less than 70, continue with statin therapy. He lost 14 pounds due to lifestyle changes. Not using continuous glucose monitor due to availability. Discussed Dexcom and Freestyle monitors, with prior Freestyle use. Samples provided for preference. - Order HbA1c test. - Provide samples of Dexcom and Freestyle continuous glucose monitors. - Schedule follow-up diabetes check in four months. I DISCUSSED WITH THE PATIENT AT LENGTH REGARDING THE GOALS OF GLYCEMIC CONTROL AND POSSIBLE LONG-TERM COMPLICATIONS.  I  ALSO STRESSED THE IMPORTANCE OF COMPLIANCE WITH HOME GLUCOSE MONITORING, DIETARY RESTRICTIONS INCLUDING AVOIDANCE OF SUGARY DRINKS/PROCESSED FOODS,  ALONG WITH REGULAR EXERCISE.  I  ALSO STRESSED THE IMPORTANCE OF ANNUAL EYE EXAMS, SELF FOOT CARE AND COMPLIANCE WITH OFFICE VISITS.    Leg cramps Assessment & Plan: Nocturnal lower leg cramps likely due to dehydration. Suggested increased fluid intake. - Check electrolytes. - Encourage increased water intake.  Orders: -     Magnesium -     CK  Class 2 severe obesity due to excess calories with serious comorbidity and body mass index (BMI) of 39.0 to 39.9 in adult [facility]) Assessment & Plan: He was congratulated on his 14lb weight loss. I hope this is not due to elevated blood sugars. He is encouraged to incorporate more exercise into his daily routine.    Other orders -     Aldolase -     Specimen status report  Return for 1 year HM, 4 MONTH DM F/U.Aaron Aas Patient was given opportunity to ask questions. Patient verbalized understanding of the plan and was able to repeat key elements of the plan. All questions were answered to their satisfaction.     I, Smiley Dung, MD, have reviewed all documentation for this visit. The documentation on 04/19/24 for the exam, diagnosis, procedures, and orders are all accurate and complete.

## 2024-04-20 ENCOUNTER — Other Ambulatory Visit: Payer: Self-pay | Admitting: Internal Medicine

## 2024-04-20 DIAGNOSIS — R748 Abnormal levels of other serum enzymes: Secondary | ICD-10-CM

## 2024-04-20 DIAGNOSIS — R7989 Other specified abnormal findings of blood chemistry: Secondary | ICD-10-CM

## 2024-04-20 LAB — CBC
Hematocrit: 49.8 % (ref 37.5–51.0)
Hemoglobin: 15.5 g/dL (ref 13.0–17.7)
MCH: 25.5 pg — ABNORMAL LOW (ref 26.6–33.0)
MCHC: 31.1 g/dL — ABNORMAL LOW (ref 31.5–35.7)
MCV: 82 fL (ref 79–97)
Platelets: 177 10*3/uL (ref 150–450)
RBC: 6.07 x10E6/uL — ABNORMAL HIGH (ref 4.14–5.80)
RDW: 15.2 % (ref 11.6–15.4)
WBC: 6.8 10*3/uL (ref 3.4–10.8)

## 2024-04-20 LAB — LIPID PANEL
Chol/HDL Ratio: 3 ratio (ref 0.0–5.0)
Cholesterol, Total: 133 mg/dL (ref 100–199)
HDL: 44 mg/dL (ref >39)
LDL Chol Calc (NIH): 73 mg/dL (ref 0–99)
Triglycerides: 81 mg/dL (ref 0–149)
VLDL Cholesterol Cal: 16 mg/dL (ref 5–40)

## 2024-04-20 LAB — HEMOGLOBIN A1C
Est. average glucose Bld gHb Est-mCnc: 131 mg/dL
Hgb A1c MFr Bld: 6.2 % — ABNORMAL HIGH (ref 4.8–5.6)

## 2024-04-20 LAB — CMP14+EGFR
ALT: 40 IU/L (ref 0–44)
AST: 28 IU/L (ref 0–40)
Albumin: 4.3 g/dL (ref 4.1–5.1)
Alkaline Phosphatase: 97 IU/L (ref 44–121)
BUN/Creatinine Ratio: 18 (ref 9–20)
BUN: 18 mg/dL (ref 6–24)
Bilirubin Total: 0.5 mg/dL (ref 0.0–1.2)
CO2: 27 mmol/L (ref 20–29)
Calcium: 9.2 mg/dL (ref 8.7–10.2)
Chloride: 99 mmol/L (ref 96–106)
Creatinine, Ser: 1 mg/dL (ref 0.76–1.27)
Globulin, Total: 3 g/dL (ref 1.5–4.5)
Glucose: 90 mg/dL (ref 70–99)
Potassium: 3.4 mmol/L — ABNORMAL LOW (ref 3.5–5.2)
Sodium: 143 mmol/L (ref 134–144)
Total Protein: 7.3 g/dL (ref 6.0–8.5)
eGFR: 95 mL/min/{1.73_m2} (ref >59)

## 2024-04-20 LAB — CK: Total CK: 737 U/L (ref 49–439)

## 2024-04-20 LAB — MICROALBUMIN / CREATININE URINE RATIO
Creatinine, Urine: 198.2 mg/dL
Microalb/Creat Ratio: 6 mg/g{creat} (ref 0–29)
Microalbumin, Urine: 12.4 ug/mL

## 2024-04-20 LAB — MAGNESIUM: Magnesium: 2.2 mg/dL (ref 1.6–2.3)

## 2024-04-20 LAB — PSA: Prostate Specific Ag, Serum: 0.4 ng/mL (ref 0.0–4.0)

## 2024-04-21 ENCOUNTER — Other Ambulatory Visit: Payer: Self-pay | Admitting: Internal Medicine

## 2024-04-21 DIAGNOSIS — R748 Abnormal levels of other serum enzymes: Secondary | ICD-10-CM

## 2024-04-22 ENCOUNTER — Encounter (HOSPITAL_BASED_OUTPATIENT_CLINIC_OR_DEPARTMENT_OTHER): Payer: Self-pay | Admitting: Family

## 2024-04-22 ENCOUNTER — Ambulatory Visit (HOSPITAL_BASED_OUTPATIENT_CLINIC_OR_DEPARTMENT_OTHER): Admitting: Family

## 2024-04-22 VITALS — BP 135/91 | HR 80 | Ht 67.0 in | Wt 231.9 lb

## 2024-04-22 DIAGNOSIS — G4733 Obstructive sleep apnea (adult) (pediatric): Secondary | ICD-10-CM

## 2024-04-22 DIAGNOSIS — E782 Mixed hyperlipidemia: Secondary | ICD-10-CM | POA: Diagnosis not present

## 2024-04-22 DIAGNOSIS — I10 Essential (primary) hypertension: Secondary | ICD-10-CM | POA: Diagnosis not present

## 2024-04-22 MED ORDER — NEBIVOLOL HCL 10 MG PO TABS: 10.0000 mg | ORAL_TABLET | Freq: Every day | ORAL | 2 refills | Status: DC

## 2024-04-22 NOTE — Patient Instructions (Signed)
 Medication Instructions:  Your physician has recommended you make the following change in your medication:   Stop: Metoprolol    Start: Nebivolol  10mg  daily     Labwork: Labs Monday with Primary Care- Renin- Aldosterone, Catecholamines & metanephrines   Follow-Up: Please follow up in JUNE in [facility] with Dr. Theodis Fiscal, Neomi Banks, NP or Donivan Furry PharmD

## 2024-04-22 NOTE — Progress Notes (Signed)
 Advanced Hypertension Clinic Initial Assessment:    Date:  04/22/2024   ID:  Austin [name], DOB 05/13/1981, MRN [id number]  PCP:  Cleave Curling, MD  Cardiologist:  None  Nephrologist:  Referring MD: Cleave Curling, MD   CC: Hypertension  History of Present Illness:    Austin [name] is a 43 y.o. male with a hx of hypertension, OSA, DM 2, obesity, LVH, hyperlipidemia here to re-establish care in the Advanced Hypertension Clinic.   Previosly seen 08/2017 for evaluation of hypertension by Dr. Theodis Fiscal.  He was switched from [place] chlor to irbesartan  and chlorthalidone .  Metoprolol  changed to carvedilol .  He was continued on amlodipine , hydralazine .  Renal Doppler 09/2017 normal.  Echo 2018 LVEF 55-60 send, mild LVH, otherwise unremarkable.  Last seen by Dr. Theodis Fiscal 10/29/2019 recommended to increase to 150 minutes of weekly exercise to optimize BP control.  Discussed the use of AI scribe software for clinical note transcription with the patient, who gave verbal consent to proceed.  History of Present Illness  The patient's primary concern is uncontrolled high blood pressure, which was first diagnosed in high school. 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 The patient checks blood pressure with arm or wrist cuff at home intermittently, especially when feeling unwell, such as during headaches, which are a common symptom when the blood pressure is high. The patient's diet is mostly home-cooked meals with low sodium intake. The patient enjoys cooking and does most of it. The patient's exercise routine is inconsistent, with a past interest in paintball. The patient has lost a few pounds since starting semaglutide  (Ozempic ). The patient uses a CPAP machine regularly for sleep apnea and reports better sleep quality with it. Notes one prior medication caused bradycardia, unclear whether this was Carvedilol  or alternative medication.  Present regimen: Amlodipine  10 mg daily,  chlorthalidone  25 mg daily, irbesartan  300 mg daily, metoprolol  50 mg daily.  Previous antihypertensives: Edarbyclor  Carvedilol   Past Medical History:  Diagnosis Date   COVID-19    DM (diabetes mellitus) (HCC)    Essential hypertension 09/18/2017   Hypertension     Past Surgical History:  Procedure Laterality Date   KNEE ARTHROSCOPY WITH ANTERIOR CRUCIATE LIGAMENT (ACL) REPAIR      Current Medications: Current Meds  Medication Sig   amLODipine  (NORVASC ) 10 MG tablet TAKE 1 TABLET(10 MG) BY MOUTH DAILY   atorvastatin  (LIPITOR) 10 MG tablet Take 1 tablet (10 mg total) by mouth daily. TAKE 1 TABLET(10 MG) BY MOUTH DAILY   chlorthalidone  (HYGROTON ) 25 MG tablet TAKE 1 TABLET(25 MG) BY MOUTH DAILY   clobetasol ointment (TEMOVATE) 0.05 % Apply to worst areas of hand  twice daily. NEVER to face/groin   fluticasone  (FLONASE ) 50 MCG/ACT nasal spray Place 2 sprays into both nostrils daily.   irbesartan  (AVAPRO ) 300 MG tablet TAKE 1 TABLET(300 MG) BY MOUTH DAILY   loratadine  (CLARITIN ) 10 MG tablet Take 10 mg by mouth daily.   MAGNESIUM CITRATE PO Take 500 mg by mouth at bedtime.    modafinil  (PROVIGIL ) 200 MG tablet Take 1 tablet (200 mg total) by mouth daily as needed.   nebivolol  (BYSTOLIC ) 10 MG tablet Take 1 tablet (10 mg total) by mouth daily.   Semaglutide , 2 MG/DOSE, (OZEMPIC , 2 MG/DOSE,) 8 MG/3ML SOPN Inject 2 mg into the skin once a week.   [DISCONTINUED] metoprolol  succinate (TOPROL  XL) 50 MG 24 hr tablet Take 1 tablet (50 mg total) by mouth daily. Take with or immediately following  a meal.     Allergies:   Patient has no known allergies.   Social History   Socioeconomic History   Marital status: Married    Spouse name: Not on file   Number of children: Not on file   Years of education: Not on file   Highest education level: Not on file  Occupational History   Not on file  Tobacco Use   Smoking status: Never   Smokeless tobacco: Never  Vaping Use   Vaping status:  Never Used  Substance and Sexual Activity   Alcohol use: Yes    Alcohol/week: 2.0 standard drinks of alcohol    Types: 2 Standard drinks or equivalent per week   Drug use: No   Sexual activity: Not on file  Other Topics Concern   Not on file  Social History Narrative   Not on file   Social Drivers of Health   Financial Resource Strain: Not on file  Food Insecurity: No Food Insecurity (04/22/2024)   Hunger Vital Sign    Worried About Running Out of Food in the Last Year: Never true    Ran Out of Food in the Last Year: Never true  Transportation Needs: No Transportation Needs (04/22/2024)   PRAPARE - Administrator, Civil Service (Medical): No    Lack of Transportation (Non-Medical): No  Physical Activity: Inactive (04/22/2024)   Exercise Vital Sign    Days of Exercise per Week: 0 days    Minutes of Exercise per Session: 0 min  Stress: Not on file  Social Connections: Unknown (09/17/2023)   Received from [facility]   Social Network    Social Network: Not on file    Family History: The patient's family history includes Diabetes in his mother; Healthy in his brother and sister; Hypertension in his maternal grandmother and maternal uncle; Multiple sclerosis in his father; Stroke in his maternal grandmother.  ROS:   Please see the history of present illness.     All other systems reviewed and are negative.  EKGs/Labs/Other Studies Reviewed:         Recent Labs: 08/18/2023: TSH 1.360 04/19/2024: ALT 40; BUN 18; Creatinine, Ser 1.00; Hemoglobin 15.5; Magnesium 2.2; Platelets 177; Potassium 3.4; Sodium 143   Recent Lipid Panel    Component Value Date/Time   CHOL 133 04/19/2024 1154   TRIG 81 04/19/2024 1154   HDL 44 04/19/2024 1154   CHOLHDL 3.0 04/19/2024 1154   LDLCALC 73 04/19/2024 1154    Physical Exam:   VS:  BP (!) 135/91 (BP Location: Right Arm, Patient Position: Sitting, Cuff Size: Large)   Pulse 80   Ht 5\' 7"  (1.702 m)   Wt 231 lb 14.4 oz (105.2  kg)   BMI 36.32 kg/m  , BMI Body mass index is 36.32 kg/m.  Vitals:   04/22/24 1010 04/22/24 1014  BP: (!) 143/91 (!) 135/91  Pulse: 80 80  Height: 5\' 7"  (1.702 m)   Weight: 231 lb 14.4 oz (105.2 kg)   BMI (Calculated): 36.31     GENERAL:  Well appearing HEENT: Pupils equal round and reactive, fundi not visualized, oral mucosa unremarkable NECK:  No jugular venous distention, waveform within normal limits, carotid upstroke brisk and symmetric, no bruits, no thyromegaly LYMPHATICS:  No cervical adenopathy LUNGS:  Clear to auscultation bilaterally HEART:  RRR.  PMI not displaced or sustained,S1 and S2 within normal limits, no S3, no S4, no clicks, no rubs, no murmurs ABD:  Flat, positive bowel  sounds normal in frequency in pitch, no bruits, no rebound, no guarding, no midline pulsatile mass, no hepatomegaly, no splenomegaly EXT:  2 plus pulses throughout, no edema, no cyanosis no clubbing SKIN:  No rashes no nodules NEURO:  Cranial nerves II through XII grossly intact, motor grossly intact throughout PSYCH:  Cognitively intact, oriented to person place and time   ASSESSMENT/PLAN:    HTN - BP not at goal <130/80.  Continue Labine 10 mg daily, irbesartan  300 mg daily, chlorthalidone  25 mg daily.   Discontinue metoprolol .  Start Nebivolol  10 mg daily.  Can further uptitrate based on BP response. Recommend plasma renin aldosterone ratio, catecholamines, metanephrines.  Will order labs today and send him with lab slips as he also has labs Monday with PCP. Recommend aiming for 150 minutes of moderate intensity activity per week and following a heart healthy diet.    DM2 - 04/19/24 A1c 6.2. Continue to follow with PCP. Appreciate inclusion of Ozempic  for cardioprotective benefit.  HLD - 04/19/24 LDL 73. Continue Atorvastatin  10mg  daily.   OSA - CPAP compliance encouraged. Endorses wearing regularly  Screening for Secondary Hypertension:     04/22/2024    1:00 PM  Causes  Renovascular  HTN Screened     - Comments 2018 no renal artery stenosis  Sleep Apnea Screened     - Comments Treated, wears CPAP regularly  Thyroid  Disease Screened  Hyperaldosteronism Screened     - Comments 04/22/24 renin-aldosterone ordered  Pheochromocytoma Screened     - Comments 04/22/24 metanephines, catecholamines ordered  Cushing's Syndrome N/A     - Comments non cushingoid appearance  Coarctation of the Aorta N/A     - Comments BP symmetrical  Compliance Screened     - Comments Takes medications routinely    Relevant Labs/Studies:    Latest Ref Rng & Units 04/19/2024   11:54 AM 01/20/2024    4:17 PM 08/18/2023   12:35 PM  Basic Labs  Sodium 134 - 144 mmol/L 143  145  143   Potassium 3.5 - 5.2 mmol/L 3.4  3.7  3.8   Creatinine 0.76 - 1.27 mg/dL 1.02  7.25  3.66        Latest Ref Rng & Units 08/18/2023   12:35 PM 02/26/2021    2:49 PM  Thyroid    TSH 0.450 - 4.500 uIU/mL 1.360  1.540                 10/03/2017   10:11 AM  Renovascular   Renal Artery US  Completed Yes     Disposition:    FU with MD/APP/PharmD in 2 months    Medication Adjustments/Labs and Tests Ordered: Current medicines are reviewed at length with the patient today.  Concerns regarding medicines are outlined above.  Orders Placed This Encounter  Procedures   Aldosterone + renin activity w/ ratio   Catecholamines, fractionated, plasma   Metanephrines, plasma   Meds ordered this encounter  Medications   nebivolol  (BYSTOLIC ) 10 MG tablet    Sig: Take 1 tablet (10 mg total) by mouth daily.    Dispense:  30 tablet    Refill:  2    Discontinue metoprolol     Supervising Provider:   Sheryle Donning [[id number]]     Signed, Clearnce Curia, NP  04/22/2024 1:09 PM    [facility]

## 2024-04-24 LAB — ALDOLASE: Aldolase: 10 U/L (ref 3.3–10.3)

## 2024-04-24 LAB — SPECIMEN STATUS REPORT

## 2024-04-26 ENCOUNTER — Other Ambulatory Visit

## 2024-04-26 ENCOUNTER — Encounter: Payer: Self-pay | Admitting: Internal Medicine

## 2024-04-26 DIAGNOSIS — R7989 Other specified abnormal findings of blood chemistry: Secondary | ICD-10-CM

## 2024-04-26 DIAGNOSIS — R748 Abnormal levels of other serum enzymes: Secondary | ICD-10-CM

## 2024-04-28 ENCOUNTER — Encounter (HOSPITAL_BASED_OUTPATIENT_CLINIC_OR_DEPARTMENT_OTHER): Payer: Self-pay

## 2024-04-28 NOTE — Telephone Encounter (Signed)
 Diarrhea only occurs 3% of the time in individuals taking Nebivolol  so uncommon side effects but it is possible.   Recommend reduce to half tablet daily to see if this improves symptoms. Check in Friday or Monday to reassess response and if still bothersome, will trial alternate medication.   Austin [name] S Chena Chohan, NP

## 2024-04-30 ENCOUNTER — Encounter (HOSPITAL_BASED_OUTPATIENT_CLINIC_OR_DEPARTMENT_OTHER): Payer: Self-pay

## 2024-04-30 LAB — IRON,TIBC AND FERRITIN PANEL
Ferritin: 195 ng/mL (ref 30–400)
Iron Saturation: 16 % (ref 15–55)
Iron: 53 ug/dL (ref 38–169)
Total Iron Binding Capacity: 334 ug/dL (ref 250–450)
UIBC: 281 ug/dL (ref 111–343)

## 2024-04-30 LAB — FANA STAINING PATTERNS: Speckled Pattern: 1:80 {titer}

## 2024-04-30 LAB — ANTINUCLEAR ANTIBODIES, IFA: ANA Titer 1: POSITIVE — AB

## 2024-04-30 LAB — CK: Total CK: 636 U/L (ref 49–439)

## 2024-05-04 ENCOUNTER — Other Ambulatory Visit: Payer: Self-pay | Admitting: Internal Medicine

## 2024-05-07 ENCOUNTER — Encounter: Payer: Self-pay | Admitting: Internal Medicine

## 2024-05-07 ENCOUNTER — Other Ambulatory Visit: Payer: Self-pay | Admitting: Internal Medicine

## 2024-05-07 DIAGNOSIS — R748 Abnormal levels of other serum enzymes: Secondary | ICD-10-CM

## 2024-05-07 DIAGNOSIS — R768 Other specified abnormal immunological findings in serum: Secondary | ICD-10-CM

## 2024-05-07 DIAGNOSIS — R252 Cramp and spasm: Secondary | ICD-10-CM

## 2024-05-13 NOTE — Telephone Encounter (Signed)
 Recommend hold Nebivolol  and update us  Monday on BP readings and symptoms.   Austin [name] S Trevione Wert, NP

## 2024-05-17 ENCOUNTER — Other Ambulatory Visit: Payer: Self-pay | Admitting: Internal Medicine

## 2024-05-17 DIAGNOSIS — I10 Essential (primary) hypertension: Secondary | ICD-10-CM

## 2024-06-30 ENCOUNTER — Encounter (HOSPITAL_BASED_OUTPATIENT_CLINIC_OR_DEPARTMENT_OTHER): Payer: Self-pay

## 2024-06-30 ENCOUNTER — Other Ambulatory Visit (HOSPITAL_COMMUNITY): Payer: Self-pay

## 2024-06-30 ENCOUNTER — Other Ambulatory Visit (HOSPITAL_BASED_OUTPATIENT_CLINIC_OR_DEPARTMENT_OTHER): Payer: Self-pay

## 2024-06-30 ENCOUNTER — Ambulatory Visit (HOSPITAL_BASED_OUTPATIENT_CLINIC_OR_DEPARTMENT_OTHER): Admitting: Family

## 2024-06-30 ENCOUNTER — Encounter (HOSPITAL_BASED_OUTPATIENT_CLINIC_OR_DEPARTMENT_OTHER): Payer: Self-pay | Admitting: Family

## 2024-06-30 VITALS — BP 128/82 | HR 64 | Ht 67.0 in | Wt 238.0 lb

## 2024-06-30 DIAGNOSIS — I10 Essential (primary) hypertension: Secondary | ICD-10-CM | POA: Diagnosis not present

## 2024-06-30 DIAGNOSIS — R768 Other specified abnormal immunological findings in serum: Secondary | ICD-10-CM | POA: Diagnosis not present

## 2024-06-30 DIAGNOSIS — E1165 Type 2 diabetes mellitus with hyperglycemia: Secondary | ICD-10-CM | POA: Diagnosis not present

## 2024-06-30 DIAGNOSIS — E782 Mixed hyperlipidemia: Secondary | ICD-10-CM | POA: Diagnosis not present

## 2024-06-30 MED ORDER — ATORVASTATIN CALCIUM 10 MG PO TABS
10.0000 mg | ORAL_TABLET | Freq: Every day | ORAL | 1 refills | Status: DC
  Filled 2024-06-30 – 2024-07-30 (×5): qty 90, 90d supply, fill #0
  Filled 2024-10-28: qty 90, 90d supply, fill #1

## 2024-06-30 MED ORDER — AMLODIPINE BESYLATE 10 MG PO TABS
10.0000 mg | ORAL_TABLET | Freq: Every day | ORAL | 1 refills | Status: DC
  Filled 2024-06-30 – 2024-09-26 (×5): qty 90, 90d supply, fill #0

## 2024-06-30 MED ORDER — IRBESARTAN 300 MG PO TABS
300.0000 mg | ORAL_TABLET | Freq: Every day | ORAL | 2 refills | Status: DC
Start: 2024-06-30 — End: 2024-06-30

## 2024-06-30 MED ORDER — CHLORTHALIDONE 25 MG PO TABS
25.0000 mg | ORAL_TABLET | Freq: Every day | ORAL | 1 refills | Status: DC
  Filled 2024-06-30 – 2024-09-26 (×5): qty 90, 90d supply, fill #0

## 2024-06-30 MED ORDER — AMLODIPINE BESYLATE 10 MG PO TABS: ORAL_TABLET | ORAL | 1 refills | Status: DC

## 2024-06-30 MED ORDER — OZEMPIC (2 MG/DOSE) 8 MG/3ML ~~LOC~~ SOPN
2.0000 mg | PEN_INJECTOR | SUBCUTANEOUS | 3 refills | Status: DC
  Filled 2024-06-30: qty 3, 28d supply, fill #0
  Filled 2024-07-30: qty 3, 28d supply, fill #1
  Filled 2024-08-17 – 2024-08-28 (×2): qty 3, 28d supply, fill #2
  Filled 2024-09-26: qty 3, 28d supply, fill #3

## 2024-06-30 MED ORDER — CHLORTHALIDONE 25 MG PO TABS: ORAL_TABLET | ORAL | 1 refills | Status: DC

## 2024-06-30 MED ORDER — IRBESARTAN 300 MG PO TABS
300.0000 mg | ORAL_TABLET | Freq: Every day | ORAL | 2 refills | Status: DC
  Filled 2024-06-30 – 2024-09-26 (×6): qty 90, 90d supply, fill #0

## 2024-06-30 MED ORDER — ATORVASTATIN CALCIUM 10 MG PO TABS
10.0000 mg | ORAL_TABLET | Freq: Every day | ORAL | 1 refills | Status: DC
Start: 2024-06-30 — End: 2024-06-30

## 2024-06-30 MED ORDER — OZEMPIC (2 MG/DOSE) 8 MG/3ML ~~LOC~~ SOPN: 2.0000 mg | PEN_INJECTOR | SUBCUTANEOUS | 3 refills | Status: DC

## 2024-06-30 NOTE — Progress Notes (Signed)
 Advanced Hypertension Clinic Assessment:    Date:  07/04/2024   ID:  Austin [name], DOB [birth year], MRN [id number]  PCP:  Jarold Medici, MD  Cardiologist:  None  Nephrologist:  Referring MD: Jarold Medici, MD   CC: Hypertension  History of Present Illness:    Austin [name] is a 43 y.o. male with a hx of hypertension, OSA, DM 2, obesity, LVH, hyperlipidemia here to follow up  in the Advanced Hypertension Clinic.   Previosly seen 08/2017 for evaluation of hypertension by Dr. Raford.  He was switched from [place] chlor to irbesartan  and chlorthalidone .  Metoprolol  changed to carvedilol .  He was continued on amlodipine , hydralazine .  Renal Doppler 09/2017 normal.  Echo 2018 LVEF 55-60 send, mild LVH, otherwise unremarkable.    Discussed the use of AI scribe software for clinical note transcription with the patient, who gave verbal consent to proceed.  History of Present Illness At visit 04/22/24 metoprolol  was transitioned to Nebivolol . However, subsequently reported diarrhea which stopped when he discontinued the medication.   Austin [name] is a 43 year old male with hypertension who presents for medication management and follow-up. He continues on amlodipine , chlorthalidone , and irbesartan  for hypertension. He has not been taking Ozempic  due to supply issues at pharmacy but previously tolerated it without gastrointestinal side effects. He denies chest pain, lightheadedness, or dizziness. He regularly uses a CPAP machine as part of his routine.  Recent positive ANA and elevated CK when checked by PCP. He is concerned about waiting til October for his scheduled rheumatology consult.   Previous antihypertensives: Edarbyclor  Carvedilol   Past Medical History:  Diagnosis Date   COVID-19    DM (diabetes mellitus) (HCC)    Essential hypertension 09/18/2017   Hypertension     Past Surgical History:  Procedure Laterality Date   KNEE ARTHROSCOPY WITH ANTERIOR CRUCIATE LIGAMENT  (ACL) REPAIR      Current Medications: Current Meds  Medication Sig   clobetasol ointment (TEMOVATE) 0.05 % Apply to worst areas of hand  twice daily. NEVER to face/groin   fluticasone  (FLONASE ) 50 MCG/ACT nasal spray Place 2 sprays into both nostrils daily.   loratadine  (CLARITIN ) 10 MG tablet Take 10 mg by mouth daily.   MAGNESIUM CITRATE PO Take 500 mg by mouth at bedtime.    modafinil  (PROVIGIL ) 200 MG tablet Take 1 tablet (200 mg total) by mouth daily as needed.   [DISCONTINUED] amLODipine  (NORVASC ) 10 MG tablet TAKE 1 TABLET(10 MG) BY MOUTH DAILY   [DISCONTINUED] atorvastatin  (LIPITOR) 10 MG tablet Take 1 tablet (10 mg total) by mouth daily. TAKE 1 TABLET(10 MG) BY MOUTH DAILY   [DISCONTINUED] chlorthalidone  (HYGROTON ) 25 MG tablet TAKE 1 TABLET(25 MG) BY MOUTH DAILY   [DISCONTINUED] irbesartan  (AVAPRO ) 300 MG tablet TAKE 1 TABLET(300 MG) BY MOUTH DAILY   [DISCONTINUED] OZEMPIC , 2 MG/DOSE, 8 MG/3ML SOPN INJECT 2MG  INTO THE SKIN ONCE A WEEK     Allergies:   Patient has no known allergies.   Social History   Socioeconomic History   Marital status: Married    Spouse name: Not on file   Number of children: Not on file   Years of education: Not on file   Highest education level: Not on file  Occupational History   Not on file  Tobacco Use   Smoking status: Never   Smokeless tobacco: Never  Vaping Use   Vaping status: Never Used  Substance and Sexual Activity   Alcohol use: Yes    Alcohol/week:  2.0 standard drinks of alcohol    Types: 2 Standard drinks or equivalent per week   Drug use: No   Sexual activity: Not on file  Other Topics Concern   Not on file  Social History Narrative   Not on file   Social Drivers of Health   Financial Resource Strain: Not on file  Food Insecurity: No Food Insecurity (04/22/2024)   Hunger Vital Sign    Worried About Running Out of Food in the Last Year: Never true    Ran Out of Food in the Last Year: Never true  Transportation Needs:  No Transportation Needs (04/22/2024)   PRAPARE - Administrator, Civil Service (Medical): No    Lack of Transportation (Non-Medical): No  Physical Activity: Inactive (04/22/2024)   Exercise Vital Sign    Days of Exercise per Week: 0 days    Minutes of Exercise per Session: 0 min  Stress: Not on file  Social Connections: Unknown (09/17/2023)   Received [facility]   Social Network    Social Network: Not on file    Family History: The patient's family history includes Diabetes in his mother; Healthy in his brother and sister; Hypertension in his maternal grandmother and maternal uncle; Multiple sclerosis in his father; Stroke in his maternal grandmother.  ROS:   Please see the history of present illness.     All other systems reviewed and are negative.  EKGs/Labs/Other Studies Reviewed:         Recent Labs: 08/18/2023: TSH 1.360 04/19/2024: ALT 40; BUN 18; Creatinine, Ser 1.00; Hemoglobin 15.5; Magnesium 2.2; Platelets 177; Potassium 3.4; Sodium 143   Recent Lipid Panel    Component Value Date/Time   CHOL 133 04/19/2024 1154   TRIG 81 04/19/2024 1154   HDL 44 04/19/2024 1154   CHOLHDL 3.0 04/19/2024 1154   LDLCALC 73 04/19/2024 1154    Physical Exam:   VS:  BP 128/82   Pulse 64   Ht 5' 7 (1.702 m)   Wt 238 lb (108 kg)   SpO2 96%   BMI 37.28 kg/m  , BMI Body mass index is 37.28 kg/m.  Vitals:   06/30/24 1048 06/30/24 1108  BP: (!) 138/90 128/82  Pulse: 64   Height: 5' 7 (1.702 m)   Weight: 238 lb (108 kg)   SpO2: 96%   BMI (Calculated): 37.27     GENERAL:  Well appearing HEENT: Pupils equal round and reactive, fundi not visualized, oral mucosa unremarkable NECK:  No jugular venous distention, waveform within normal limits, carotid upstroke brisk and symmetric, no bruits, no thyromegaly LYMPHATICS:  No cervical adenopathy LUNGS:  Clear to auscultation bilaterally HEART:  RRR.  PMI not displaced or sustained,S1 and S2 within normal limits, no  S3, no S4, no clicks, no rubs, no murmurs ABD:  Flat, positive bowel sounds normal in frequency in pitch, no bruits, no rebound, no guarding, no midline pulsatile mass, no hepatomegaly, no splenomegaly EXT:  2 plus pulses throughout, no edema, no cyanosis no clubbing SKIN:  No rashes no nodules NEURO:  Cranial nerves II through XII grossly intact, motor grossly intact throughout PSYCH:  Cognitively intact, oriented to person place and time   ASSESSMENT/PLAN:    HTN - BP at goal <130/80 on recheck.  Continue Amlodipine  10 mg daily, irbesartan  300 mg daily, chlorthalidone  25 mg daily.   Plasma renin aldosterone ratio, catecholamines, metanephrines ordered but not collected, as BP controlled will defer labs Recommend aiming for 150  minutes of moderate intensity activity per week and following a heart healthy diet.    DM2 - 04/19/24 A1c 6.2. Continue to follow with PCP. Appreciate inclusion of Ozempic  for cardioprotective benefit. Per his request, will switch Rx to [facility] pharmacy due to supply issues at present pharmacy. Further refills through PCP.   Elevated ANA - has been referred to rheumatology by PCP due to positive ANA, elevated CK (04/26/24 636) but concerned by having to wait til October for appt. Will refer to alternate rheumatology practice to see if sooner appt available.   HLD - 04/19/24 LDL 73. Continue Atorvastatin  10mg  daily.   OSA - CPAP compliance encouraged. Endorses wearing regularly  Screening for Secondary Hypertension:     04/22/2024    1:00 PM  Causes  Renovascular HTN Screened     - Comments 2018 no renal artery stenosis  Sleep Apnea Screened     - Comments Treated, wears CPAP regularly  Thyroid  Disease Screened  Hyperaldosteronism Screened     - Comments 04/22/24 renin-aldosterone ordered  Pheochromocytoma Screened     - Comments 04/22/24 metanephines, catecholamines ordered  Cushing's Syndrome N/A     - Comments non cushingoid appearance   Coarctation of the Aorta N/A     - Comments BP symmetrical  Compliance Screened     - Comments Takes medications routinely    Relevant Labs/Studies:    Latest Ref Rng & Units 04/19/2024   11:54 AM 01/20/2024    4:17 PM 08/18/2023   12:35 PM  Basic Labs  Sodium 134 - 144 mmol/L 143  145  143   Potassium 3.5 - 5.2 mmol/L 3.4  3.7  3.8   Creatinine 0.76 - 1.27 mg/dL 8.99  8.85  8.92        Latest Ref Rng & Units 08/18/2023   12:35 PM 02/26/2021    2:49 PM  Thyroid    TSH 0.450 - 4.500 uIU/mL 1.360  1.540                 10/03/2017   10:11 AM  Renovascular   Renal Artery US  Completed Yes     Disposition:    FU with MD/APP/PharmD in 4 months    Medication Adjustments/Labs and Tests Ordered: Current medicines are reviewed at length with the patient today.  Concerns regarding medicines are outlined above.  Orders Placed This Encounter  Procedures   Ambulatory referral to Rheumatology   Meds ordered this encounter  Medications   DISCONTD: irbesartan  (AVAPRO ) 300 MG tablet    Sig: Take 1 tablet (300 mg total) by mouth daily.    Dispense:  90 tablet    Refill:  2    Supervising Provider:   LONNI SLAIN [[id number]]   DISCONTD: chlorthalidone  (HYGROTON ) 25 MG tablet    Sig: TAKE 1 TABLET(25 MG) BY MOUTH DAILY    Dispense:  90 tablet    Refill:  1    Supervising Provider:   CHRISTOPHER, BRIDGETTE [[id number]]   DISCONTD: atorvastatin  (LIPITOR) 10 MG tablet    Sig: Take 1 tablet (10 mg total) by mouth daily. TAKE 1 TABLET(10 MG) BY MOUTH DAILY    Dispense:  90 tablet    Refill:  1    Supervising Provider:   CHRISTOPHER, BRIDGETTE [[id number]]   DISCONTD: amLODipine  (NORVASC ) 10 MG tablet    Sig: TAKE 1 TABLET(10 MG) BY MOUTH DAILY    Dispense:  90 tablet    Refill:  1    Supervising Provider:  LONNI SLAIN [[id number]]   DISCONTD: Semaglutide , 2 MG/DOSE, (OZEMPIC , 2 MG/DOSE,) 8 MG/3ML SOPN    Sig: Inject 2 mg into the skin once a week.    Dispense:  3 mL     Refill:  3    Supervising Provider:   LONNI SLAIN [[id number]]   amLODipine  (NORVASC ) 10 MG tablet    Sig: TAKE 1 TABLET(10 MG) BY MOUTH DAILY    Dispense:  90 tablet    Refill:  1   atorvastatin  (LIPITOR) 10 MG tablet    Sig: Take 1 tablet (10 mg total) by mouth daily.    Dispense:  90 tablet    Refill:  1   chlorthalidone  (HYGROTON ) 25 MG tablet    Sig: Take 1 tablet (25 mg total) by mouth daily.    Dispense:  90 tablet    Refill:  1   irbesartan  (AVAPRO ) 300 MG tablet    Sig: Take 1 tablet (300 mg total) by mouth daily.    Dispense:  90 tablet    Refill:  2   Semaglutide , 2 MG/DOSE, (OZEMPIC , 2 MG/DOSE,) 8 MG/3ML SOPN    Sig: Inject 2 mg into the skin once a week.    Dispense:  3 mL    Refill:  3     Signed, Reche GORMAN Finder, NP  07/04/2024 1:54 PM    [facility]

## 2024-06-30 NOTE — Patient Instructions (Addendum)
 Medication Instructions:  Your physician recommends that you continue on your current medications as directed. Please refer to the Current Medication list given to you today.    Follow-Up: Please follow up in 4 months in ADV HTN CLINIC with Dr. Raford, Reche Finder, NP or Allean Mink PharmD    Special Instructions:    [facility] [address] [place], [place] [address] Phone [phone] Dr. Mai or Dr. [facility] [address] [place],  [place]  [address] Main: [phone] Dr. Curt

## 2024-07-01 ENCOUNTER — Other Ambulatory Visit (HOSPITAL_COMMUNITY): Payer: Self-pay

## 2024-07-01 ENCOUNTER — Telehealth (HOSPITAL_BASED_OUTPATIENT_CLINIC_OR_DEPARTMENT_OTHER): Payer: Self-pay | Admitting: *Deleted

## 2024-07-01 ENCOUNTER — Telehealth: Payer: Self-pay | Admitting: Pharmacy Technician

## 2024-07-01 ENCOUNTER — Telehealth: Payer: Self-pay

## 2024-07-01 ENCOUNTER — Other Ambulatory Visit (HOSPITAL_BASED_OUTPATIENT_CLINIC_OR_DEPARTMENT_OTHER): Payer: Self-pay

## 2024-07-01 NOTE — Telephone Encounter (Signed)
   No prior auth needed for ozempic . I asked pharmacy to fill. Cost is 24.99

## 2024-07-01 NOTE — Telephone Encounter (Signed)
 Received notification patient need PA for Ozempic  Will forward to PA team

## 2024-07-01 NOTE — Telephone Encounter (Addendum)
 Glenice Krabbe, CPhT to Me     07/01/24  4:01 PM No prior auth needed. I asked pharmacy to run. Thank you    Left message on [facility] voicemail no PA needed

## 2024-07-05 ENCOUNTER — Telehealth: Payer: Self-pay | Admitting: Cardiovascular Disease

## 2024-07-05 NOTE — Telephone Encounter (Signed)
 Omega from [facility] called and stated there was a referral sent to Rheumatologist  they are needing last 2 labs and office notes faxed to [phone]  Best number [phone] ext [phone]

## 2024-07-05 NOTE — Telephone Encounter (Signed)
Please send records for referral

## 2024-07-07 ENCOUNTER — Other Ambulatory Visit (HOSPITAL_BASED_OUTPATIENT_CLINIC_OR_DEPARTMENT_OTHER): Payer: Self-pay

## 2024-07-07 ENCOUNTER — Other Ambulatory Visit (HOSPITAL_COMMUNITY): Payer: Self-pay

## 2024-07-12 DIAGNOSIS — G4733 Obstructive sleep apnea (adult) (pediatric): Secondary | ICD-10-CM | POA: Diagnosis not present

## 2024-07-21 ENCOUNTER — Telehealth: Payer: Self-pay | Admitting: Cardiovascular Disease

## 2024-07-21 ENCOUNTER — Encounter (HOSPITAL_BASED_OUTPATIENT_CLINIC_OR_DEPARTMENT_OTHER): Payer: Self-pay

## 2024-07-21 NOTE — Telephone Encounter (Signed)
 They received the referral from our office but they also needs office notes and labs sent to there office. Fax # [phone]. Please advise

## 2024-07-22 ENCOUNTER — Telehealth: Payer: Self-pay | Admitting: Cardiovascular Disease

## 2024-07-22 NOTE — Telephone Encounter (Signed)
 Office called to say they need office notes and labs for the patient. Fax # [phone]. Please advise

## 2024-07-23 NOTE — Telephone Encounter (Signed)
 Follow Up:       She says she needs a copy of the patient's insurance card. Please fax to [phone].

## 2024-07-23 NOTE — Telephone Encounter (Signed)
 Insurance cards faxed over to # listed below.

## 2024-07-30 ENCOUNTER — Other Ambulatory Visit: Payer: Self-pay

## 2024-07-30 ENCOUNTER — Other Ambulatory Visit (HOSPITAL_BASED_OUTPATIENT_CLINIC_OR_DEPARTMENT_OTHER): Payer: Self-pay

## 2024-08-12 DIAGNOSIS — G4733 Obstructive sleep apnea (adult) (pediatric): Secondary | ICD-10-CM | POA: Diagnosis not present

## 2024-08-17 ENCOUNTER — Other Ambulatory Visit (HOSPITAL_BASED_OUTPATIENT_CLINIC_OR_DEPARTMENT_OTHER): Payer: Self-pay

## 2024-08-17 DIAGNOSIS — M1710 Unilateral primary osteoarthritis, unspecified knee: Secondary | ICD-10-CM | POA: Diagnosis not present

## 2024-08-17 DIAGNOSIS — R748 Abnormal levels of other serum enzymes: Secondary | ICD-10-CM | POA: Diagnosis not present

## 2024-08-17 DIAGNOSIS — R768 Other specified abnormal immunological findings in serum: Secondary | ICD-10-CM | POA: Diagnosis not present

## 2024-08-24 ENCOUNTER — Encounter: Payer: Self-pay | Admitting: Internal Medicine

## 2024-08-24 ENCOUNTER — Ambulatory Visit: Admitting: Internal Medicine

## 2024-08-24 ENCOUNTER — Other Ambulatory Visit (HOSPITAL_BASED_OUTPATIENT_CLINIC_OR_DEPARTMENT_OTHER): Payer: Self-pay

## 2024-08-24 ENCOUNTER — Telehealth: Payer: Self-pay

## 2024-08-24 VITALS — BP 140/82 | HR 82 | Temp 98.5°F | Ht 67.0 in | Wt 236.0 lb

## 2024-08-24 DIAGNOSIS — I10 Essential (primary) hypertension: Secondary | ICD-10-CM | POA: Diagnosis not present

## 2024-08-24 DIAGNOSIS — Z6836 Body mass index (BMI) 36.0-36.9, adult: Secondary | ICD-10-CM

## 2024-08-24 DIAGNOSIS — E785 Hyperlipidemia, unspecified: Secondary | ICD-10-CM | POA: Diagnosis not present

## 2024-08-24 DIAGNOSIS — E66812 Obesity, class 2: Secondary | ICD-10-CM | POA: Diagnosis not present

## 2024-08-24 DIAGNOSIS — E1169 Type 2 diabetes mellitus with other specified complication: Secondary | ICD-10-CM

## 2024-08-24 DIAGNOSIS — R748 Abnormal levels of other serum enzymes: Secondary | ICD-10-CM

## 2024-08-24 MED ORDER — FREESTYLE LIBRE 3 SENSOR MISC
1.0000 | 2 refills | Status: DC
  Filled 2024-08-24: qty 2, 28d supply, fill #0
  Filled 2024-10-23: qty 2, 28d supply, fill #1
  Filled 2024-11-23: qty 2, 28d supply, fill #2

## 2024-08-24 NOTE — Patient Instructions (Signed)

## 2024-08-24 NOTE — Assessment & Plan Note (Addendum)
 Chronic, fair control.  He will continue with amlodipine  10mg  daily, chlorthalidone  25mg , irbesartan  300mg  daily and metoprolol  25mg  daily. He is encouraged to follow low sodium diet.  Recent blood pressure 140/82 mmHg. Previous nebivolol  use caused diarrhea, discontinued. - Encourage more frequent blood pressure monitoring at work. - Consider titration of metoprolol  - Encouraged to increase daily activity

## 2024-08-24 NOTE — Telephone Encounter (Signed)
 Prior shara has been submitted through covermymeds. We are waiting on the determination. YL,RMA

## 2024-08-24 NOTE — Progress Notes (Signed)
 I,Austin [name], CMA,acting as a Neurosurgeon for Austin LOISE Slocumb, MD.,have documented all relevant documentation on the behalf of Austin LOISE Slocumb, MD,as directed by  Austin LOISE Slocumb, MD while in the presence of Austin LOISE Slocumb, MD.  Subjective:  Patient ID: Austin [name] , male    DOB: [birth year] , 43 y.o.   MRN: [id number]  Chief Complaint  Patient presents with   Diabetes    Patient is here for a f/u on his diabetes and blood pressure. He reports compliance with meds. He denies having any headaches, chest pain and shortness of breath.  He has no specific questions or concerns.      Hypertension    HPI Discussed the use of AI scribe software for clinical note transcription with the patient, who gave verbal consent to proceed.  History of Present Illness Austin [name] is a 43 year old male with diabetes and hypertension who presents for a diabetes and blood pressure check.  He has not been checking his blood sugar levels recently. He previously used the Santo sensor but experienced issues with it falling off and has not yet tried the Dexcom sensor. He switched his primary pharmacy to [facility] due to availability issues with his previous pharmacy. He is currently taking amlodipine , atorvastatin , chlorthalidone , irbesartan , and uses Provigil  as needed, approximately once every couple of months. He also uses Ozempic  but has not picked up some of his refills yet.  He experiences muscle cramping, which is alleviated by drinking water before bed. A previous rheumatology evaluation noted elevated CK levels, though not as severely as before. Follow-up is planned in six months.  His blood pressure was recorded at 140/82 mmHg. He checks his blood pressure at home or work only when feeling unwell. He previously tried nebivolol , which caused diarrhea, and his blood pressure was noted to be 120/80 mmHg at a recent rheumatology visit.  He works in [employer] and describes his work as 'not  too bad'. No current feelings of being unwell and reports feeling fine. He acknowledges previous muscle cramping but notes it is less likely to occur if he drinks water before bed.   Hypertension This is a chronic problem. The current episode started more than 1 year ago. The problem has been gradually improving since onset. The problem is controlled. Pertinent negatives include no blurred vision, chest pain, palpitations or shortness of breath. The current treatment provides moderate improvement. Compliance problems include exercise.   Diabetes He presents for his follow-up diabetic visit. He has type 2 diabetes mellitus. Pertinent negatives for diabetes include no blurred vision, no chest pain, no polydipsia, no polyphagia and no polyuria. There are no hypoglycemic complications. Risk factors for coronary artery disease include diabetes mellitus, hypertension, male sex and obesity. An ACE inhibitor/angiotensin II receptor blocker is being taken.     Past Medical History:  Diagnosis Date   COVID-19    DM (diabetes mellitus) (HCC)    Essential hypertension 09/18/2017   Hypertension      Family History  Problem Relation Age of Onset   Diabetes Mother    Multiple sclerosis Father    Healthy Sister    Healthy Brother    Hypertension Maternal Grandmother    Stroke Maternal Grandmother    Hypertension Maternal Uncle      Current Outpatient Medications:    amLODipine  (NORVASC ) 10 MG tablet, Take 1 tablet (10 mg total) by mouth daily., Disp: 90 tablet, Rfl: 1   atorvastatin  (LIPITOR) 10 MG  tablet, Take 1 tablet (10 mg total) by mouth daily., Disp: 90 tablet, Rfl: 1   chlorthalidone  (HYGROTON ) 25 MG tablet, Take 1 tablet (25 mg total) by mouth daily., Disp: 90 tablet, Rfl: 1   clobetasol ointment (TEMOVATE) 0.05 %, Apply to worst areas of hand  twice daily. NEVER to face/groin, Disp: , Rfl:    Continuous Glucose Sensor (FREESTYLE LIBRE 3 SENSOR) MISC, Place 1 sensor on the arm to check  glucose continuously and change every 14 days., Disp: 2 each, Rfl: 2   fluticasone  (FLONASE ) 50 MCG/ACT nasal spray, Place 2 sprays into both nostrils daily., Disp: 16 g, Rfl: 1   irbesartan  (AVAPRO ) 300 MG tablet, Take 1 tablet (300 mg total) by mouth daily., Disp: 90 tablet, Rfl: 2   loratadine  (CLARITIN ) 10 MG tablet, Take 10 mg by mouth daily., Disp: , Rfl:    MAGNESIUM CITRATE PO, Take 500 mg by mouth at bedtime. , Disp: , Rfl:    modafinil  (PROVIGIL ) 200 MG tablet, Take 1 tablet (200 mg total) by mouth daily as needed., Disp: 15 tablet, Rfl: 0   Semaglutide , 2 MG/DOSE, (OZEMPIC , 2 MG/DOSE,) 8 MG/3ML SOPN, Inject 2 mg into the skin once a week., Disp: 3 mL, Rfl: 3   potassium chloride  SA (KLOR-CON  M) 20 MEQ tablet, Take 1 tablet (20 mEq total) by mouth 2 (two) times daily. For TWO Days ONLY, please contact MD for further instructions., Disp: 30 tablet, Rfl: 0   No Known Allergies   Review of Systems  Constitutional: Negative.   Eyes: Negative.  Negative for blurred vision.  Respiratory: Negative.  Negative for shortness of breath.   Cardiovascular: Negative.  Negative for chest pain and palpitations.  Gastrointestinal: Negative.   Endocrine: Negative for polydipsia, polyphagia and polyuria.  Musculoskeletal: Negative.   Skin: Negative.   Psychiatric/Behavioral: Negative.       Today's Vitals   08/24/24 1106 08/24/24 1134  BP: (!) 140/80 (!) 140/82  Pulse: 82   Temp: 98.5 F (36.9 C)   TempSrc: Oral   Weight: 236 lb (107 kg)   Height: 5' 7 (1.702 m)   PainSc: 0-No pain    Body mass index is 36.96 kg/m.  Wt Readings from Last 3 Encounters:  08/24/24 236 lb (107 kg)  06/30/24 238 lb (108 kg)  04/22/24 231 lb 14.4 oz (105.2 kg)    The 10-year ASCVD risk score (Arnett DK, et al., 2019) is: 13.7%   Values used to calculate the score:     Age: 69 years     Clincally relevant sex: Male     Is Non-Hispanic African American: Yes     Diabetic: Yes     Tobacco smoker: No      Systolic Blood Pressure: 140 mmHg     Is BP treated: Yes     HDL Cholesterol: 44 mg/dL     Total Cholesterol: 133 mg/dL  Objective:  Physical Exam Vitals and nursing note reviewed.  Constitutional:      Appearance: Normal appearance. He is obese.  HENT:     Head: Normocephalic and atraumatic.  Eyes:     Extraocular Movements: Extraocular movements intact.  Cardiovascular:     Rate and Rhythm: Normal rate and regular rhythm.     Heart sounds: Normal heart sounds.  Pulmonary:     Effort: Pulmonary effort is normal.     Breath sounds: Normal breath sounds.  Musculoskeletal:     Cervical back: Normal range of motion.  Skin:  General: Skin is warm.  Neurological:     General: No focal deficit present.     Mental Status: He is alert.  Psychiatric:        Mood and Affect: Mood normal.         Assessment And Plan:  Dyslipidemia associated with type 2 diabetes mellitus (HCC) Assessment & Plan: Chronic, poor glucose monitoring due to non-use of continuous glucose monitoring. Previous Libre sensors detached easily.  Hyperlipidemia is addressed with atorvastatin .  - Provide Libre sensors and instruct to pair with new Libre app with butterfly icon. - Send sensors to [facility] and perform prior authorization if necessary. - Encourage ordering stickers from [employer] to secure sensors. - Continue with weekly Ozempic  2mg  weekly  Orders: -     CMP14+EGFR -     Hemoglobin A1c -     FreeStyle Libre 3 Sensor; Place 1 sensor on the arm to check glucose continuously and change every 14 days.  Dispense: 2 each; Refill: 2  Essential hypertension, benign Assessment & Plan: Chronic, fair control.  He will continue with amlodipine  10mg  daily, chlorthalidone  25mg , irbesartan  300mg  daily and metoprolol  25mg  daily. He is encouraged to follow low sodium diet.  Recent blood pressure 140/82 mmHg. Previous nebivolol  use caused diarrhea, discontinued. - Encourage more frequent blood  pressure monitoring at work. - Consider titration of metoprolol  - Encouraged to increase daily activity   Elevated CPK Assessment & Plan: He is encouraged to stay well hydrated. He is now followed by Rheumatology.    Class 2 severe obesity due to excess calories with serious comorbidity and body mass index (BMI) of 36.0 to 36.9 in adult [facility]) Assessment & Plan: He is encouraged to incorporate more exercise into his daily routine.     Return for controlled DM check-4 months.  Patient was given opportunity to ask questions. Patient verbalized understanding of the plan and was able to repeat key elements of the plan. All questions were answered to their satisfaction.    I, Austin LOISE Slocumb, MD, have reviewed all documentation for this visit. The documentation on 08/24/24 for the exam, diagnosis, procedures, and orders are all accurate and complete.   IF YOU HAVE BEEN REFERRED TO A SPECIALIST, IT MAY TAKE 1-2 WEEKS TO SCHEDULE/PROCESS THE REFERRAL. IF YOU HAVE NOT HEARD FROM US /SPECIALIST IN TWO WEEKS, PLEASE GIVE US  A CALL AT [phone].

## 2024-08-25 ENCOUNTER — Other Ambulatory Visit: Payer: Self-pay | Admitting: Internal Medicine

## 2024-08-25 ENCOUNTER — Ambulatory Visit: Payer: Self-pay | Admitting: Internal Medicine

## 2024-08-25 LAB — CMP14+EGFR
ALT: 32 IU/L (ref 0–44)
AST: 24 IU/L (ref 0–40)
Albumin: 4.5 g/dL (ref 4.1–5.1)
Alkaline Phosphatase: 83 IU/L (ref 44–121)
BUN/Creatinine Ratio: 15 (ref 9–20)
BUN: 17 mg/dL (ref 6–24)
Bilirubin Total: 0.4 mg/dL (ref 0.0–1.2)
CO2: 26 mmol/L (ref 20–29)
Calcium: 9.6 mg/dL (ref 8.7–10.2)
Chloride: 101 mmol/L (ref 96–106)
Creatinine, Ser: 1.15 mg/dL (ref 0.76–1.27)
Globulin, Total: 2.6 g/dL (ref 1.5–4.5)
Glucose: 94 mg/dL (ref 70–99)
Potassium: 3.2 mmol/L — ABNORMAL LOW (ref 3.5–5.2)
Sodium: 143 mmol/L (ref 134–144)
Total Protein: 7.1 g/dL (ref 6.0–8.5)
eGFR: 80 mL/min/1.73 (ref >59)

## 2024-08-25 LAB — HEMOGLOBIN A1C
Est. average glucose Bld gHb Est-mCnc: 140 mg/dL
Hgb A1c MFr Bld: 6.5 % — ABNORMAL HIGH (ref 4.8–5.6)

## 2024-08-25 MED ORDER — POTASSIUM CHLORIDE CRYS ER 20 MEQ PO TBCR
20.0000 meq | EXTENDED_RELEASE_TABLET | Freq: Two times a day (BID) | ORAL | 0 refills | Status: DC
  Filled 2024-08-25: qty 30, 15d supply, fill #0

## 2024-08-26 ENCOUNTER — Other Ambulatory Visit (HOSPITAL_BASED_OUTPATIENT_CLINIC_OR_DEPARTMENT_OTHER): Payer: Self-pay

## 2024-08-28 DIAGNOSIS — R748 Abnormal levels of other serum enzymes: Secondary | ICD-10-CM | POA: Insufficient documentation

## 2024-08-28 NOTE — Assessment & Plan Note (Signed)
 He is encouraged to incorporate more exercise into his daily routine.

## 2024-08-28 NOTE — Assessment & Plan Note (Signed)
 He is encouraged to stay well hydrated. He is now followed by Rheumatology.

## 2024-08-28 NOTE — Assessment & Plan Note (Addendum)
 Chronic, poor glucose monitoring due to non-use of continuous glucose monitoring. Previous Libre sensors detached easily.  Hyperlipidemia is addressed with atorvastatin .  - Provide Libre sensors and instruct to pair with new Libre app with butterfly icon. - Send sensors to [facility] and perform prior authorization if necessary. - Encourage ordering stickers from [employer] to secure sensors. - Continue with weekly Ozempic  2mg  weekly

## 2024-09-06 ENCOUNTER — Encounter: Payer: Self-pay | Admitting: *Deleted

## 2024-09-07 ENCOUNTER — Telehealth (INDEPENDENT_AMBULATORY_CARE_PROVIDER_SITE_OTHER): Payer: BC Managed Care – PPO | Admitting: Adult Health

## 2024-09-07 DIAGNOSIS — G4733 Obstructive sleep apnea (adult) (pediatric): Secondary | ICD-10-CM | POA: Diagnosis not present

## 2024-09-07 NOTE — Progress Notes (Signed)
 PATIENT: Austin [name] DOB: 1981/10/17  REASON FOR VISIT: follow up HISTORY FROM: patient   Virtual Visit via Video Note  I connected with Austin [name] on 09/07/24 at  1:45 PM EDT by a video enabled telemedicine application located remotely at [facility] and verified that I am speaking with the correct person using two identifiers who was located at their own home in [place]   I discussed the limitations of evaluation and management by telemedicine and the availability of in person appointments. The patient expressed understanding and agreed to proceed.   PATIENT: Austin [name] DOB: Jul 17, 1981  REASON FOR VISIT: follow up HISTORY FROM: patient  HISTORY OF PRESENT ILLNESS: Today 09/07/24:  Austin [name] is a 43 y.o. male with a history of OSA on CPAP. Returns today for follow-up.  Reports that CPAP is working well.  He continues to find it beneficial.  Currently has the nasal pillows.  Denies any new medical history.  Download is below        08/29/23: Austin [name] is a 43 y.o. male with a history of OSA on CPAP. Returns today for follow-up. Reports that CPAP is working well. Has two machines-1 at work and 1 at home.  His download is below.       REVIEW OF SYSTEMS: Out of a complete 14 system review of symptoms, the patient complains only of the following symptoms, and all other reviewed systems are negative.  ALLERGIES: No Known Allergies  HOME MEDICATIONS: Outpatient Medications Prior to Visit  Medication Sig Dispense Refill   amLODipine  (NORVASC ) 10 MG tablet Take 1 tablet (10 mg total) by mouth daily. 90 tablet 1   atorvastatin  (LIPITOR) 10 MG tablet Take 1 tablet (10 mg total) by mouth daily. 90 tablet 1   chlorthalidone  (HYGROTON ) 25 MG tablet Take 1 tablet (25 mg total) by mouth daily. 90 tablet 1   clobetasol ointment (TEMOVATE) 0.05 % Apply to worst areas of hand  twice daily. NEVER to face/groin     Continuous Glucose Sensor  (FREESTYLE LIBRE 3 SENSOR) MISC Place 1 sensor on the arm to check glucose continuously and change every 14 days. 2 each 2   fluticasone  (FLONASE ) 50 MCG/ACT nasal spray Place 2 sprays into both nostrils daily. 16 g 1   irbesartan  (AVAPRO ) 300 MG tablet Take 1 tablet (300 mg total) by mouth daily. 90 tablet 2   loratadine  (CLARITIN ) 10 MG tablet Take 10 mg by mouth daily.     MAGNESIUM CITRATE PO Take 500 mg by mouth at bedtime.      modafinil  (PROVIGIL ) 200 MG tablet Take 1 tablet (200 mg total) by mouth daily as needed. 15 tablet 0   potassium chloride  SA (KLOR-CON  M) 20 MEQ tablet Take 1 tablet (20 mEq total) by mouth 2 (two) times daily. For TWO Days ONLY, please contact MD for further instructions. 30 tablet 0   Semaglutide , 2 MG/DOSE, (OZEMPIC , 2 MG/DOSE,) 8 MG/3ML SOPN Inject 2 mg into the skin once a week. 3 mL 3   No facility-administered medications prior to visit.    PAST MEDICAL HISTORY: Past Medical History:  Diagnosis Date   COVID-19    DM (diabetes mellitus) (HCC)    Essential hypertension 09/18/2017   Hypertension     PAST SURGICAL HISTORY: Past Surgical History:  Procedure Laterality Date   KNEE ARTHROSCOPY WITH ANTERIOR CRUCIATE LIGAMENT (ACL) REPAIR      FAMILY HISTORY: Family History  Problem Relation  Age of Onset   Diabetes Mother    Multiple sclerosis Father    Healthy Sister    Healthy Brother    Hypertension Maternal Grandmother    Stroke Maternal Grandmother    Hypertension Maternal Uncle     SOCIAL HISTORY: Social History   Socioeconomic History   Marital status: Married    Spouse name: Not on file   Number of children: Not on file   Years of education: Not on file   Highest education level: Not on file  Occupational History   Not on file  Tobacco Use   Smoking status: Never   Smokeless tobacco: Never  Vaping Use   Vaping status: Never Used  Substance and Sexual Activity   Alcohol use: Yes    Alcohol/week: 2.0 standard drinks of  alcohol    Types: 2 Standard drinks or equivalent per week   Drug use: No   Sexual activity: Not on file  Other Topics Concern   Not on file  Social History Narrative   Not on file   Social Drivers of Health   Financial Resource Strain: Not on file  Food Insecurity: No Food Insecurity (04/22/2024)   Hunger Vital Sign    Worried About Running Out of Food in the Last Year: Never true    Ran Out of Food in the Last Year: Never true  Transportation Needs: No Transportation Needs (04/22/2024)   PRAPARE - Administrator, Civil Service (Medical): No    Lack of Transportation (Non-Medical): No  Physical Activity: Inactive (04/22/2024)   Exercise Vital Sign    Days of Exercise per Week: 0 days    Minutes of Exercise per Session: 0 min  Stress: Not on file  Social Connections: Unknown (09/17/2023)   Received from [facility]   Social Network    Social Network: Not on file  Intimate Partner Violence: Unknown (09/17/2023)   Received from [facility]   HITS    Physically Hurt: Not on file    Insult or Talk Down To: Not on file    Threaten Physical Harm: Not on file    Scream or Curse: Not on file      PHYSICAL EXAM Generalized: Well developed, in no acute distress   Neurological examination  Mentation: Alert oriented to time, place, history taking. Follows all commands speech and language fluent Cranial nerve II-XII: Facial symmetry noted  DIAGNOSTIC DATA (LABS, IMAGING, TESTING) - I reviewed patient records, labs, notes, testing and imaging myself where available.  Lab Results  Component Value Date   WBC 6.8 04/19/2024   HGB 15.5 04/19/2024   HCT 49.8 04/19/2024   MCV 82 04/19/2024   PLT 177 04/19/2024      Component Value Date/Time   NA 143 08/24/2024 1143   K 3.2 (L) 08/24/2024 1143   CL 101 08/24/2024 1143   CO2 26 08/24/2024 1143   GLUCOSE 94 08/24/2024 1143   GLUCOSE 189 (H) 01/25/2020 0719   BUN 17 08/24/2024 1143   CREATININE 1.15 08/24/2024  1143   CALCIUM  9.6 08/24/2024 1143   PROT 7.1 08/24/2024 1143   ALBUMIN 4.5 08/24/2024 1143   AST 24 08/24/2024 1143   ALT 32 08/24/2024 1143   ALKPHOS 83 08/24/2024 1143   BILITOT 0.4 08/24/2024 1143   GFRNONAA 99 10/05/2020 1147   GFRAA 114 10/05/2020 1147   Lab Results  Component Value Date   CHOL 133 04/19/2024   HDL 44 04/19/2024   LDLCALC 73 04/19/2024  TRIG 81 04/19/2024   CHOLHDL 3.0 04/19/2024   Lab Results  Component Value Date   HGBA1C 6.5 (H) 08/24/2024   No results found for: VITAMINB12 Lab Results  Component Value Date   TSH 1.360 08/18/2023      ASSESSMENT AND PLAN 43 y.o. year old male  has a past medical history of COVID-19, DM (diabetes mellitus) (HCC), Essential hypertension (09/18/2017), and Hypertension. here with:  OSA on CPAP  CPAP compliance excellent Residual AHI is good Encouraged patient to continue using CPAP nightly and > 4 hours each night F/U in 1 year or sooner if needed    Duwaine Russell, MSN, NP-C 09/07/2024, 1:10 PM [facility] [address], [address] [place], [place] [address] [phone] . The patient's condition requires frequent monitoring and adjustments in the treatment plan, reflecting the ongoing complexity of care.  This provider is the continuing focal point for all needed services for this condition.

## 2024-09-07 NOTE — Patient Instructions (Signed)
 Continue using CPAP nightly and greater than 4 hours each night If your symptoms worsen or you develop new symptoms please let us  know.

## 2024-09-12 DIAGNOSIS — G4733 Obstructive sleep apnea (adult) (pediatric): Secondary | ICD-10-CM | POA: Diagnosis not present

## 2024-09-26 ENCOUNTER — Other Ambulatory Visit (HOSPITAL_COMMUNITY): Payer: Self-pay

## 2024-09-26 ENCOUNTER — Other Ambulatory Visit (HOSPITAL_BASED_OUTPATIENT_CLINIC_OR_DEPARTMENT_OTHER): Payer: Self-pay

## 2024-10-15 ENCOUNTER — Encounter: Admitting: Internal Medicine

## 2024-10-15 NOTE — Progress Notes (Deleted)
 Office Visit Note  Patient: Austin [name]             Date of Birth: 31-Mar-1981           MRN: [id number]             PCP: Jarold Medici, MD Referring: Jarold Medici, MD Visit Date: 10/15/2024 Occupation: Data Unavailable  Subjective:  No chief complaint on file.   History of Present Illness: Austin [name] is a 43 y.o. male ***     Activities of Daily Living:  Patient reports morning stiffness for *** {minute/hour:19697}.   Patient {ACTIONS;DENIES/REPORTS:21021675::Denies} nocturnal pain.  Difficulty dressing/grooming: {ACTIONS;DENIES/REPORTS:21021675::Denies} Difficulty climbing stairs: {ACTIONS;DENIES/REPORTS:21021675::Denies} Difficulty getting out of chair: {ACTIONS;DENIES/REPORTS:21021675::Denies} Difficulty using hands for taps, buttons, cutlery, and/or writing: {ACTIONS;DENIES/REPORTS:21021675::Denies}  No Rheumatology ROS completed.   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   Essential hypertension, benign 09/18/2017   Snoring 11/27/2016   Excessive daytime sleepiness 11/27/2016   OSA (obstructive sleep apnea) 11/27/2016   Class 2 severe obesity due to excess calories with serious comorbidity and body mass index (BMI) of 36.0 to 36.9 in adult 11/27/2016    Past Medical History:  Diagnosis Date   COVID-19    DM (diabetes mellitus) (HCC)    Essential hypertension 09/18/2017   Hypertension     Family History  Problem Relation Age of Onset   Diabetes Mother    Multiple sclerosis Father    Healthy Sister    Healthy Brother     Hypertension Maternal Grandmother    Stroke Maternal Grandmother    Hypertension Maternal Uncle    Past Surgical History:  Procedure Laterality Date   KNEE ARTHROSCOPY WITH ANTERIOR CRUCIATE LIGAMENT (ACL) REPAIR     Social History   Tobacco Use   Smoking status: Never   Smokeless tobacco: Never  Vaping Use   Vaping status: Never Used  Substance Use Topics   Alcohol use: Yes    Alcohol/week: 2.0 standard drinks of alcohol    Types: 2 Standard drinks or equivalent per week   Drug use: No   Social History   Social History Narrative   Not on file     Immunization History  Administered Date(s) Administered   Influenza,inj,Quad PF,6+ Mos 09/23/2019   PFIZER(Purple Top)SARS-COV-2 Vaccination 01/16/2020, 03/05/2020, 10/10/2020   Tdap 04/06/2020     Objective: Vital Signs: There were no vitals taken for this visit.   Physical Exam   Musculoskeletal Exam: ***  CDAI Exam: CDAI Score: -- Patient Global: --; Provider Global: -- Swollen: --; Tender: -- Joint Exam 10/15/2024   No joint exam has been documented for this visit   There is currently no information documented on the homunculus. Go to the Rheumatology activity and complete the homunculus joint exam.  Investigation: No additional findings.  Imaging: No results found.  Recent Labs: Lab Results  Component Value Date   WBC 6.8 04/19/2024   HGB 15.5 04/19/2024   PLT 177 04/19/2024   NA 143 08/24/2024   K 3.2 (L) 08/24/2024   CL 101 08/24/2024   CO2 26 08/24/2024   GLUCOSE 94 08/24/2024  BUN 17 08/24/2024   CREATININE 1.15 08/24/2024   BILITOT 0.4 08/24/2024   ALKPHOS 83 08/24/2024   AST 24 08/24/2024   ALT 32 08/24/2024   PROT 7.1 08/24/2024   ALBUMIN 4.5 08/24/2024   CALCIUM  9.6 08/24/2024   GFRAA 114 10/05/2020   QFTBGOLDPLUS Indeterminate (A) 01/23/2020    Speciality Comments: No specialty comments available.  Procedures:  No procedures performed Allergies: Patient has no known  allergies.   Assessment / Plan:     Visit Diagnoses: No diagnosis found.  Orders: No orders of the defined types were placed in this encounter.  No orders of the defined types were placed in this encounter.   Face-to-face time spent with patient was *** minutes. Greater than 50% of time was spent in counseling and coordination of care.  Follow-Up Instructions: No follow-ups on file.   Lonni LELON Ester, MD  Note - This record has been created using [employer].  Chart creation errors have been sought, but may not always  have been located. Such creation errors do not reflect on  the standard of medical care.

## 2024-10-25 ENCOUNTER — Other Ambulatory Visit (HOSPITAL_BASED_OUTPATIENT_CLINIC_OR_DEPARTMENT_OTHER): Payer: Self-pay

## 2024-10-29 ENCOUNTER — Other Ambulatory Visit (HOSPITAL_BASED_OUTPATIENT_CLINIC_OR_DEPARTMENT_OTHER): Payer: Self-pay

## 2024-10-29 ENCOUNTER — Other Ambulatory Visit (HOSPITAL_BASED_OUTPATIENT_CLINIC_OR_DEPARTMENT_OTHER): Payer: Self-pay | Admitting: Family

## 2024-10-29 DIAGNOSIS — E1165 Type 2 diabetes mellitus with hyperglycemia: Secondary | ICD-10-CM

## 2024-10-29 MED ORDER — OZEMPIC (2 MG/DOSE) 8 MG/3ML ~~LOC~~ SOPN
2.0000 mg | PEN_INJECTOR | SUBCUTANEOUS | 3 refills | Status: AC
  Filled 2024-10-29: qty 3, 28d supply, fill #0
  Filled 2024-11-27: qty 3, 28d supply, fill #1
  Filled 2025-01-03: qty 3, 28d supply, fill #2
  Filled 2025-01-28: qty 3, 28d supply, fill #3

## 2024-11-03 ENCOUNTER — Encounter (HOSPITAL_BASED_OUTPATIENT_CLINIC_OR_DEPARTMENT_OTHER): Payer: Self-pay

## 2024-11-04 ENCOUNTER — Other Ambulatory Visit (HOSPITAL_BASED_OUTPATIENT_CLINIC_OR_DEPARTMENT_OTHER): Payer: Self-pay

## 2024-11-04 ENCOUNTER — Encounter (HOSPITAL_BASED_OUTPATIENT_CLINIC_OR_DEPARTMENT_OTHER): Payer: Self-pay | Admitting: Family

## 2024-11-04 ENCOUNTER — Ambulatory Visit (HOSPITAL_BASED_OUTPATIENT_CLINIC_OR_DEPARTMENT_OTHER): Admitting: Family

## 2024-11-04 VITALS — BP 128/80 | HR 89 | Ht 67.0 in | Wt 239.8 lb

## 2024-11-04 DIAGNOSIS — I10 Essential (primary) hypertension: Secondary | ICD-10-CM | POA: Diagnosis not present

## 2024-11-04 DIAGNOSIS — G4733 Obstructive sleep apnea (adult) (pediatric): Secondary | ICD-10-CM | POA: Diagnosis not present

## 2024-11-04 DIAGNOSIS — E782 Mixed hyperlipidemia: Secondary | ICD-10-CM

## 2024-11-04 MED ORDER — CHLORTHALIDONE 25 MG PO TABS
25.0000 mg | ORAL_TABLET | Freq: Every day | ORAL | 3 refills | Status: AC
  Filled 2024-11-04 – 2025-01-03 (×2): qty 90, 90d supply, fill #0

## 2024-11-04 MED ORDER — IRBESARTAN 300 MG PO TABS
300.0000 mg | ORAL_TABLET | Freq: Every day | ORAL | 3 refills | Status: AC
  Filled 2024-11-04 – 2025-01-03 (×2): qty 90, 90d supply, fill #0

## 2024-11-04 MED ORDER — AMLODIPINE BESYLATE 10 MG PO TABS
10.0000 mg | ORAL_TABLET | Freq: Every day | ORAL | 3 refills | Status: AC
  Filled 2024-11-04 – 2025-01-03 (×2): qty 90, 90d supply, fill #0

## 2024-11-04 NOTE — Progress Notes (Signed)
 [facility] Assessment:    Date:  11/04/2024   ID:  Austin [name], DOB 12-Nov-1981, MRN [id number]  PCP:  Austin Medici, MD  Cardiologist:  None  Nephrologist:  Referring MD: Austin Medici, MD   CC: Hypertension  History of Present Illness:    Austin [name] is a 43 y.o. male with a hx of hypertension, OSA, DM 2, obesity, LVH, hyperlipidemia here to follow up  in the [facility].   Previosly seen 08/2017 for evaluation of hypertension by Dr. Raford.  He was switched from Edarbi chlor to irbesartan  and chlorthalidone .  Metoprolol  changed to carvedilol .  He was continued on amlodipine , hydralazine .  Renal Doppler 09/2017 normal.  Echo 2018 LVEF 55-60 send, mild LVH, otherwise unremarkable.    At visit 04/22/24 metoprolol  was transitioned to Nebivolol . However, subsequently reported diarrhea which stopped when he discontinued the medication.   At visit 06/30/24 BP was at goal on recheck. Amlodipine  10mg  daily, irbesartan  300mg  daily, chlorthalidone  25mg  daily continued. As BP controlled, labs for secondary hypertension workup. He was awaiting rheumatology appointment due to positive ANA, elevated CK.  Presents today for follow up independently. 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  Feeling well since last seen. Had reassuring visit with rheumatology visit and they plan to monitor numbers. Reports no shortness of breath nor dyspnea on exertion. Reports no chest pain, pressure, or tightness. No edema, orthopnea, PND. Reports no palpitations.  Checking BP at clinic visits, not at home routinely. No formal exercise routine. Considering body weights at the home.   Previous antihypertensives: Edarbyclor  Carvedilol   Past Medical History:  Diagnosis Date   COVID-19    DM (diabetes mellitus) (HCC)    Essential hypertension 09/18/2017   Hypertension     Past Surgical History:  Procedure Laterality Date   KNEE ARTHROSCOPY WITH ANTERIOR  CRUCIATE LIGAMENT (ACL) REPAIR      Current Medications: Current Meds  Medication Sig   amLODipine  (NORVASC ) 10 MG tablet Take 1 tablet (10 mg total) by mouth daily.   atorvastatin  (LIPITOR) 10 MG tablet Take 1 tablet (10 mg total) by mouth daily.   chlorthalidone  (HYGROTON ) 25 MG tablet Take 1 tablet (25 mg total) by mouth daily.   clobetasol ointment (TEMOVATE) 0.05 % Apply to worst areas of hand  twice daily. NEVER to face/groin   Continuous Glucose Sensor (FREESTYLE LIBRE 3 SENSOR) MISC Place 1 sensor on the arm to check glucose continuously and change every 14 days.   fluticasone  (FLONASE ) 50 MCG/ACT nasal spray Place 2 sprays into both nostrils daily.   irbesartan  (AVAPRO ) 300 MG tablet Take 1 tablet (300 mg total) by mouth daily.   loratadine  (CLARITIN ) 10 MG tablet Take 10 mg by mouth daily.   MAGNESIUM CITRATE PO Take 500 mg by mouth at bedtime.    modafinil  (PROVIGIL ) 200 MG tablet Take 1 tablet (200 mg total) by mouth daily as needed.   potassium chloride  SA (KLOR-CON  M) 20 MEQ tablet Take 1 tablet (20 mEq total) by mouth 2 (two) times daily. For TWO Days ONLY, please contact MD for further instructions.   Semaglutide , 2 MG/DOSE, (OZEMPIC , 2 MG/DOSE,) 8 MG/3ML SOPN Inject 2 mg into the skin once a week.     Allergies:   Patient has no known allergies.   Social History   Socioeconomic History   Marital status: Married    Spouse name: Not on file   Number of children: Not on file   Years  of education: Not on file   Highest education level: Not on file  Occupational History   Not on file  Tobacco Use   Smoking status: Never   Smokeless tobacco: Never  Vaping Use   Vaping status: Never Used  Substance and Sexual Activity   Alcohol use: Yes    Alcohol/week: 2.0 standard drinks of alcohol    Types: 2 Standard drinks or equivalent per week   Drug use: No   Sexual activity: Not on file  Other Topics Concern   Not on file  Social History Narrative   Not on file    Social Drivers of Health   Financial Resource Strain: Not on file  Food Insecurity: No Food Insecurity (04/22/2024)   Hunger Vital Sign    Worried About Running Out of Food in the Last Year: Never true    Ran Out of Food in the Last Year: Never true  Transportation Needs: No Transportation Needs (04/22/2024)   PRAPARE - Administrator, Civil Service (Medical): No    Lack of Transportation (Non-Medical): No  Physical Activity: Inactive (04/22/2024)   Exercise Vital Sign    Days of Exercise per Week: 0 days    Minutes of Exercise per Session: 0 min  Stress: Not on file  Social Connections: Unknown (09/17/2023)   Received from [facility]   Social Network    Social Network: Not on file    Family History: The patient's family history includes Diabetes in his mother; Healthy in his brother and sister; Hypertension in his maternal grandmother and maternal uncle; Multiple sclerosis in his father; Stroke in his maternal grandmother.  ROS:   Please see the history of present illness.     All other systems reviewed and are negative.  EKGs/Labs/Other Studies Reviewed:         Recent Labs: 04/19/2024: Hemoglobin 15.5; Magnesium 2.2; Platelets 177 08/24/2024: ALT 32; BUN 17; Creatinine, Ser 1.15; Potassium 3.2; Sodium 143   Recent Lipid Panel    Component Value Date/Time   CHOL 133 04/19/2024 1154   TRIG 81 04/19/2024 1154   HDL 44 04/19/2024 1154   CHOLHDL 3.0 04/19/2024 1154   LDLCALC 73 04/19/2024 1154    Physical Exam:   VS:  BP 128/80   Pulse 89   Ht 5' 7 (1.702 m)   Wt 239 lb 12.8 oz (108.8 kg)   SpO2 94%   BMI 37.56 kg/m  , BMI Body mass index is 37.56 kg/m.  Vitals:   11/04/24 1105 11/04/24 1119  BP: (!) 134/90 128/80  Pulse: 89   Height: 5' 7 (1.702 m)   Weight: 239 lb 12.8 oz (108.8 kg)   SpO2: 94%   BMI (Calculated): 37.55     GENERAL:  Well appearing HEENT: Pupils equal round and reactive, fundi not visualized, oral mucosa  unremarkable NECK:  No jugular venous distention, waveform within normal limits, carotid upstroke brisk and symmetric, no bruits, no thyromegaly LYMPHATICS:  No cervical adenopathy LUNGS:  Clear to auscultation bilaterally HEART:  RRR.  PMI not displaced or sustained,S1 and S2 within normal limits, no S3, no S4, no clicks, no rubs, no murmurs ABD:  Flat, positive bowel sounds normal in frequency in pitch, no bruits, no rebound, no guarding, no midline pulsatile mass, no hepatomegaly, no splenomegaly EXT:  2 plus pulses throughout, no edema, no cyanosis no clubbing SKIN:  No rashes no nodules NEURO:  Cranial nerves II through XII grossly intact, motor grossly intact throughout PSYCH:  Cognitively intact, oriented to person place and time   ASSESSMENT/PLAN:    HTN - BP at goal <130/80 on recheck.  Continue Amlodipine  10 mg daily, irbesartan  300 mg daily, chlorthalidone  25 mg daily.  Continue same, refills provided.  Plasma renin aldosterone ratio, catecholamines, metanephrines previously ordered but not collected, as BP controlled will defer labs Recommend aiming for 150 minutes of moderate intensity activity per week and following a heart healthy diet.    DM2 - Continue to follow with PCP. Appreciate inclusion of Ozempic  for cardioprotective benefit.   HLD - 04/19/24 LDL 73. Continue Atorvastatin  10mg  daily.   OSA - CPAP compliance encouraged. Endorses wearing regularly  Screening for Secondary Hypertension:     04/22/2024    1:00 PM  Causes  Renovascular HTN Screened     - Comments 2018 no renal artery stenosis  Sleep Apnea Screened     - Comments Treated, wears CPAP regularly  Thyroid  Disease Screened  Hyperaldosteronism Screened     - Comments 04/22/24 renin-aldosterone ordered  Pheochromocytoma Screened     - Comments 04/22/24 metanephines, catecholamines ordered  Cushing's Syndrome N/A     - Comments non cushingoid appearance  Coarctation of the Aorta N/A     - Comments BP  symmetrical  Compliance Screened     - Comments Takes medications routinely    Relevant Labs/Studies:    Latest Ref Rng & Units 08/24/2024   11:43 AM 04/19/2024   11:54 AM 01/20/2024    4:17 PM  Basic Labs  Sodium 134 - 144 mmol/L 143  143  145   Potassium 3.5 - 5.2 mmol/L 3.2  3.4  3.7   Creatinine 0.76 - 1.27 mg/dL 8.84  8.99  8.85        Latest Ref Rng & Units 08/18/2023   12:35 PM 02/26/2021    2:49 PM  Thyroid    TSH 0.450 - 4.500 uIU/mL 1.360  1.540                 10/03/2017   10:11 AM  Renovascular   Renal Artery US  Completed Yes     Disposition:    FU with MD/APP/PharmD in 1 year    Medication Adjustments/Labs and Tests Ordered: Current medicines are reviewed at length with the patient today.  Concerns regarding medicines are outlined above.  No orders of the defined types were placed in this encounter.  No orders of the defined types were placed in this encounter.    Signed, Reche GORMAN Finder, NP  11/04/2024 11:20 AM    [facility]

## 2024-11-04 NOTE — Patient Instructions (Signed)
 Medication Instructions:   Continue your current medications   Follow-Up: Please follow up in 1 year in ADV HTN CLINIC with Dr. Raford, Reche Finder, NP or Allean Mink PharmD    Special Instructions:

## 2024-11-05 DIAGNOSIS — E119 Type 2 diabetes mellitus without complications: Secondary | ICD-10-CM | POA: Diagnosis not present

## 2024-11-05 DIAGNOSIS — H40003 Preglaucoma, unspecified, bilateral: Secondary | ICD-10-CM | POA: Diagnosis not present

## 2024-11-23 ENCOUNTER — Other Ambulatory Visit (HOSPITAL_BASED_OUTPATIENT_CLINIC_OR_DEPARTMENT_OTHER): Payer: Self-pay

## 2024-12-02 ENCOUNTER — Other Ambulatory Visit: Payer: Self-pay

## 2024-12-02 ENCOUNTER — Encounter: Payer: Self-pay | Admitting: Internal Medicine

## 2024-12-02 ENCOUNTER — Ambulatory Visit: Payer: Self-pay | Admitting: Internal Medicine

## 2024-12-02 ENCOUNTER — Other Ambulatory Visit (HOSPITAL_BASED_OUTPATIENT_CLINIC_OR_DEPARTMENT_OTHER): Payer: Self-pay

## 2024-12-02 VITALS — BP 122/78 | HR 85 | Temp 98.3°F | Ht 67.0 in | Wt 238.8 lb

## 2024-12-02 DIAGNOSIS — E1165 Type 2 diabetes mellitus with hyperglycemia: Secondary | ICD-10-CM

## 2024-12-02 DIAGNOSIS — E782 Mixed hyperlipidemia: Secondary | ICD-10-CM

## 2024-12-02 DIAGNOSIS — E785 Hyperlipidemia, unspecified: Secondary | ICD-10-CM | POA: Diagnosis not present

## 2024-12-02 DIAGNOSIS — E1169 Type 2 diabetes mellitus with other specified complication: Secondary | ICD-10-CM

## 2024-12-02 DIAGNOSIS — R0981 Nasal congestion: Secondary | ICD-10-CM

## 2024-12-02 DIAGNOSIS — I10 Essential (primary) hypertension: Secondary | ICD-10-CM

## 2024-12-02 MED ORDER — ATORVASTATIN CALCIUM 10 MG PO TABS
10.0000 mg | ORAL_TABLET | Freq: Every day | ORAL | 2 refills | Status: AC
  Filled 2024-12-02 – 2025-01-28 (×2): qty 90, 90d supply, fill #0

## 2024-12-02 MED ORDER — FREESTYLE LIBRE 3 SENSOR MISC
1.0000 | 6 refills | Status: AC
  Filled 2025-01-03: qty 2, 28d supply, fill #0
  Filled 2025-01-28: qty 2, 28d supply, fill #1

## 2024-12-02 NOTE — Progress Notes (Unsigned)
 I,Victoria T Emmitt, CMA,acting as a neurosurgeon for Catheryn LOISE Slocumb, MD.,have documented all relevant documentation on the behalf of Catheryn LOISE Slocumb, MD,as directed by  Catheryn LOISE Slocumb, MD while in the presence of Catheryn LOISE Slocumb, MD.  Subjective:  Patient ID: Austin [name] , male    DOB: 12-15-1981 , 43 y.o.   MRN: [id number]  Chief Complaint  Patient presents with   Diabetes    Patient presents today for dm & bpc. He reports compliance with medications. Denies headache, chest pain & sob.   Hypertension    HPI Discussed the use of AI scribe software for clinical note transcription with the patient, who gave verbal consent to proceed.  History of Present Illness Austin [name] is a 43 year old male with diabetes and hypertension who presents for a diabetes and blood pressure check.  He recently had an eye exam at [facility] in [facility], but the report has not been sent to the clinic. He does not have a portal for him.  He visited a rheumatologist over the summer and recalls being informed that some African-Americans have high baseline CPK numbers.  He has experienced sinus congestion over the past few days, accompanied by a fever and difficulty breathing. He has been taking over-the-counter Sudafed and Zyrtec almost daily, but no other antihistamines.  He is currently taking amlodipine  and chlorthalidone  for blood pressure, and Ozempic  for diabetes. He recently increased his dietary potassium intake. He received a notification from [facility] about a prescription change from three to three plus.  He has not received a flu shot and does not typically get one. He also has not considered getting the shingles vaccine.  His work is going well and is 'pretty calm'. He has not been able to exercise much due to a busy schedule but hopes it will clear up soon.   Diabetes He presents for his follow-up diabetic visit. He has type 2 diabetes mellitus. There are no hypoglycemic associated  symptoms. Pertinent negatives for diabetes include no blurred vision and no chest pain. There are no hypoglycemic complications. Risk factors for coronary artery disease include diabetes mellitus, hypertension, male sex and obesity. An ACE inhibitor/angiotensin II receptor blocker is being taken.  Hypertension This is a chronic problem. The current episode started more than 1 year ago. The problem has been gradually improving since onset. The problem is controlled. Pertinent negatives include no blurred vision, chest pain, palpitations or shortness of breath. Risk factors for coronary artery disease include sedentary lifestyle, male gender and obesity. Past treatments include beta blockers, calcium  channel blockers and diuretics. The current treatment provides moderate improvement. Compliance problems include exercise.   Hyperlipidemia The current episode started more than 1 year ago. Exacerbating diseases include diabetes and obesity. Pertinent negatives include no chest pain or shortness of breath. Current antihyperlipidemic treatment includes statins. The current treatment provides moderate improvement of lipids. Risk factors for coronary artery disease include diabetes mellitus, dyslipidemia, male sex and hypertension.     Past Medical History:  Diagnosis Date   COVID-19    DM (diabetes mellitus) (HCC)    Essential hypertension 09/18/2017   Hypertension      Family History  Problem Relation Age of Onset   Diabetes Mother    Multiple sclerosis Father    Healthy Sister    Healthy Brother    Hypertension Maternal Grandmother    Stroke Maternal Grandmother    Hypertension Maternal Uncle      Current Outpatient  Medications:    amLODipine  (NORVASC ) 10 MG tablet, Take 1 tablet (10 mg total) by mouth daily., Disp: 90 tablet, Rfl: 3   chlorthalidone  (HYGROTON ) 25 MG tablet, Take 1 tablet (25 mg total) by mouth daily., Disp: 90 tablet, Rfl: 3   clobetasol ointment (TEMOVATE) 0.05 %, Apply to  worst areas of hand  twice daily. NEVER to face/groin, Disp: , Rfl:    fluticasone  (FLONASE ) 50 MCG/ACT nasal spray, Place 2 sprays into both nostrils daily., Disp: 16 g, Rfl: 1   irbesartan  (AVAPRO ) 300 MG tablet, Take 1 tablet (300 mg total) by mouth daily., Disp: 90 tablet, Rfl: 3   loratadine  (CLARITIN ) 10 MG tablet, Take 10 mg by mouth daily., Disp: , Rfl:    MAGNESIUM CITRATE PO, Take 500 mg by mouth at bedtime. , Disp: , Rfl:    modafinil  (PROVIGIL ) 200 MG tablet, Take 1 tablet (200 mg total) by mouth daily as needed., Disp: 15 tablet, Rfl: 0   Semaglutide , 2 MG/DOSE, (OZEMPIC , 2 MG/DOSE,) 8 MG/3ML SOPN, Inject 2 mg into the skin once a week., Disp: 3 mL, Rfl: 3   atorvastatin  (LIPITOR) 10 MG tablet, Take 1 tablet (10 mg total) by mouth daily., Disp: 90 tablet, Rfl: 2   Continuous Glucose Sensor (FREESTYLE LIBRE 3 SENSOR) MISC, Place 1 sensor on the arm to check glucose continuously and change every 14 days., Disp: 2 each, Rfl: 6   No Known Allergies   Review of Systems  Constitutional: Negative.   HENT:  Positive for congestion.   Eyes:  Negative for blurred vision.  Respiratory: Negative.  Negative for shortness of breath.   Cardiovascular: Negative.  Negative for chest pain and palpitations.  Gastrointestinal: Negative.   Skin: Negative.   Allergic/Immunologic: Negative.   Psychiatric/Behavioral: Negative.       Today's Vitals   12/02/24 1151  BP: 122/78  Pulse: 85  Temp: 98.3 F (36.8 C)  SpO2: 98%  Weight: 238 lb 12.8 oz (108.3 kg)  Height: 5' 7 (1.702 m)   Body mass index is 37.4 kg/m.  Wt Readings from Last 3 Encounters:  12/02/24 238 lb 12.8 oz (108.3 kg)  11/04/24 239 lb 12.8 oz (108.8 kg)  08/24/24 236 lb (107 kg)    The 10-year ASCVD risk score (Arnett DK, et al., 2019) is: 10.7%   Values used to calculate the score:     Age: 82 years     Clincally relevant sex: Male     Is Non-Hispanic African American: Yes     Diabetic: Yes     Tobacco smoker: No      Systolic Blood Pressure: 122 mmHg     Is BP treated: Yes     HDL Cholesterol: 44 mg/dL     Total Cholesterol: 133 mg/dL  Objective:  Physical Exam Vitals and nursing note reviewed.  Constitutional:      Appearance: Normal appearance. He is obese.  HENT:     Head: Normocephalic and atraumatic.  Eyes:     Extraocular Movements: Extraocular movements intact.  Cardiovascular:     Rate and Rhythm: Normal rate and regular rhythm.     Heart sounds: Normal heart sounds.  Pulmonary:     Effort: Pulmonary effort is normal.     Breath sounds: Normal breath sounds.  Musculoskeletal:     Cervical back: Normal range of motion.  Skin:    General: Skin is warm.  Neurological:     General: No focal deficit present.  Mental Status: He is alert.  Psychiatric:        Mood and Affect: Mood normal.         Assessment And Plan:   Assessment & Plan Dyslipidemia associated with type 2 diabetes mellitus (HCC) Chronic.  Diabetes management ongoing with Ozempic . Awaiting eye exam report for retinopathy assessment. A1c to monitor glycemic control.  LDL goal is less than 70.   - Checked A1c. - Requested eye exam report from [facility] in [facility].  Type 2 diabetes mellitus with hyperglycemia, without long-term current use of insulin  (HCC) Chronic, continue with Ozempic  and atorvastatin .   Essential hypertension, benign Chronic, well controlled.  He will continue with amlodipine  10mg  daily, chlorthalidone  25mg , irbesartan  300mg  daily and metoprolol  25mg  daily. He is encouraged to follow low sodium diet.  - Encouraged to increase daily activity Morbid obesity (HCC) BMI 37 along with DM/HTN. He is encouraged to strive to lose ten percent of his body weight to decrease cardiac risk.  Sinus congestion He was given samples of Norel AD to take twice daily as needed.  - Avoid dairy while symptomatic - Notify me if his sx persist.  Orders Placed This Encounter  Procedures   Hemoglobin  A1c   BMP8+EGFR    Return for 4 month dm f/u.SABRA  Patient was given opportunity to ask questions. Patient verbalized understanding of the plan and was able to repeat key elements of the plan. All questions were answered to their satisfaction.    I, Catheryn LOISE Slocumb, MD, have reviewed all documentation for this visit. The documentation on 12/02/24 for the exam, diagnosis, procedures, and orders are all accurate and complete.   IF YOU HAVE BEEN REFERRED TO A SPECIALIST, IT MAY TAKE 1-2 WEEKS TO SCHEDULE/PROCESS THE REFERRAL. IF YOU HAVE NOT HEARD FROM US /SPECIALIST IN TWO WEEKS, PLEASE GIVE US  A CALL AT [phone].

## 2024-12-02 NOTE — Patient Instructions (Signed)

## 2024-12-03 LAB — BMP8+EGFR
BUN/Creatinine Ratio: 14 (ref 9–20)
BUN: 14 mg/dL (ref 6–24)
CO2: 28 mmol/L (ref 20–29)
Calcium: 9.5 mg/dL (ref 8.7–10.2)
Chloride: 98 mmol/L (ref 96–106)
Creatinine, Ser: 0.97 mg/dL (ref 0.76–1.27)
Glucose: 85 mg/dL (ref 70–99)
Potassium: 3.4 mmol/L — ABNORMAL LOW (ref 3.5–5.2)
Sodium: 141 mmol/L (ref 134–144)
eGFR: 98 mL/min/1.73 (ref >59)

## 2024-12-03 LAB — HEMOGLOBIN A1C
Est. average glucose Bld gHb Est-mCnc: 128 mg/dL
Hgb A1c MFr Bld: 6.1 % — ABNORMAL HIGH (ref 4.8–5.6)

## 2024-12-05 DIAGNOSIS — R0981 Nasal congestion: Secondary | ICD-10-CM | POA: Insufficient documentation

## 2024-12-05 NOTE — Assessment & Plan Note (Signed)
 BMI 37 along with DM/HTN. He is encouraged to strive to lose ten percent of his body weight to decrease cardiac risk.

## 2024-12-05 NOTE — Assessment & Plan Note (Signed)
 Chronic, continue with Ozempic  and atorvastatin .

## 2024-12-05 NOTE — Assessment & Plan Note (Signed)
 Chronic.  Diabetes management ongoing with Ozempic . Awaiting eye exam report for retinopathy assessment. A1c to monitor glycemic control.  LDL goal is less than 70.   - Checked A1c. - Requested eye exam report from [facility] in [facility].

## 2024-12-05 NOTE — Assessment & Plan Note (Signed)
 He was given samples of Norel AD to take twice daily as needed.  - Avoid dairy while symptomatic - Notify me if his sx persist.

## 2024-12-05 NOTE — Assessment & Plan Note (Signed)
 Chronic, well controlled.  He will continue with amlodipine  10mg  daily, chlorthalidone  25mg , irbesartan  300mg  daily and metoprolol  25mg  daily. He is encouraged to follow low sodium diet.  - Encouraged to increase daily activity

## 2024-12-06 ENCOUNTER — Ambulatory Visit: Payer: Self-pay | Admitting: Internal Medicine

## 2024-12-06 MED ORDER — POTASSIUM CHLORIDE CRYS ER 20 MEQ PO TBCR
EXTENDED_RELEASE_TABLET | ORAL | 0 refills | Status: AC
  Filled 2024-12-06: qty 30, 30d supply, fill #0

## 2024-12-07 ENCOUNTER — Other Ambulatory Visit (HOSPITAL_BASED_OUTPATIENT_CLINIC_OR_DEPARTMENT_OTHER): Payer: Self-pay

## 2024-12-08 ENCOUNTER — Encounter: Payer: Self-pay | Admitting: Internal Medicine

## 2024-12-13 ENCOUNTER — Other Ambulatory Visit: Payer: Self-pay

## 2024-12-17 ENCOUNTER — Other Ambulatory Visit (HOSPITAL_BASED_OUTPATIENT_CLINIC_OR_DEPARTMENT_OTHER): Payer: Self-pay

## 2025-01-03 ENCOUNTER — Other Ambulatory Visit (HOSPITAL_BASED_OUTPATIENT_CLINIC_OR_DEPARTMENT_OTHER): Payer: Self-pay

## 2025-01-05 ENCOUNTER — Other Ambulatory Visit (HOSPITAL_BASED_OUTPATIENT_CLINIC_OR_DEPARTMENT_OTHER): Payer: Self-pay

## 2025-01-05 ENCOUNTER — Other Ambulatory Visit: Payer: Self-pay

## 2025-01-05 MED ORDER — LATANOPROST 0.005 % OP SOLN
1.0000 [drp] | Freq: Every day | OPHTHALMIC | 7 refills | Status: AC
  Filled 2025-01-05: qty 7.5, 75d supply, fill #0

## 2025-01-28 ENCOUNTER — Other Ambulatory Visit (HOSPITAL_BASED_OUTPATIENT_CLINIC_OR_DEPARTMENT_OTHER): Payer: Self-pay

## 2025-01-28 ENCOUNTER — Other Ambulatory Visit: Payer: Self-pay

## 2025-04-07 ENCOUNTER — Ambulatory Visit: Admitting: Internal Medicine

## 2025-04-21 ENCOUNTER — Encounter: Payer: Self-pay | Admitting: Internal Medicine

## 2025-09-06 ENCOUNTER — Telehealth: Admitting: Adult Health
